# Patient Record
Sex: Male | Born: 1945 | Race: White | Hispanic: No | Marital: Married | Smoking: Never smoker
Health system: Southern US, Community
[De-identification: ages and names within clinical notes are randomized; demographics above are authoritative.]

---

## 2020-02-19 ENCOUNTER — Emergency Department: Payer: No Typology Code available for payment source

## 2020-02-19 ENCOUNTER — Other Ambulatory Visit: Payer: Self-pay

## 2020-02-19 ENCOUNTER — Inpatient Hospital Stay
Admission: EM | Admit: 2020-02-19 | Discharge: 2020-02-28 | DRG: 871 | Disposition: A | Discharge status: Hospice | Payer: No Typology Code available for payment source | Attending: Internal Medicine | Admitting: Internal Medicine

## 2020-02-19 DIAGNOSIS — Z87891 Personal history of nicotine dependence: Secondary | ICD-10-CM

## 2020-02-19 DIAGNOSIS — Z6841 Body Mass Index (BMI) 40.0 and over, adult: Secondary | ICD-10-CM

## 2020-02-19 DIAGNOSIS — J189 Pneumonia, unspecified organism: Secondary | ICD-10-CM

## 2020-02-19 DIAGNOSIS — B965 Pseudomonas (aeruginosa) (mallei) (pseudomallei) as the cause of diseases classified elsewhere: Secondary | ICD-10-CM | POA: Diagnosis present

## 2020-02-19 DIAGNOSIS — A419 Sepsis, unspecified organism: Principal | ICD-10-CM

## 2020-02-19 DIAGNOSIS — I493 Ventricular premature depolarization: Secondary | ICD-10-CM | POA: Diagnosis present

## 2020-02-19 DIAGNOSIS — R918 Other nonspecific abnormal finding of lung field: Secondary | ICD-10-CM

## 2020-02-19 DIAGNOSIS — R062 Wheezing: Secondary | ICD-10-CM

## 2020-02-19 DIAGNOSIS — E872 Acidosis: Secondary | ICD-10-CM | POA: Diagnosis present

## 2020-02-19 DIAGNOSIS — N390 Urinary tract infection, site not specified: Secondary | ICD-10-CM | POA: Diagnosis present

## 2020-02-19 DIAGNOSIS — D649 Anemia, unspecified: Secondary | ICD-10-CM | POA: Diagnosis present

## 2020-02-19 DIAGNOSIS — I10 Essential (primary) hypertension: Secondary | ICD-10-CM

## 2020-02-19 DIAGNOSIS — R4182 Altered mental status, unspecified: Secondary | ICD-10-CM | POA: Diagnosis not present

## 2020-02-19 DIAGNOSIS — F4310 Post-traumatic stress disorder, unspecified: Secondary | ICD-10-CM | POA: Diagnosis present

## 2020-02-19 DIAGNOSIS — F0391 Unspecified dementia with behavioral disturbance: Secondary | ICD-10-CM | POA: Diagnosis present

## 2020-02-19 DIAGNOSIS — Z20822 Contact with and (suspected) exposure to covid-19: Secondary | ICD-10-CM | POA: Diagnosis present

## 2020-02-19 DIAGNOSIS — R509 Fever, unspecified: Secondary | ICD-10-CM

## 2020-02-19 DIAGNOSIS — G9341 Metabolic encephalopathy: Secondary | ICD-10-CM

## 2020-02-19 DIAGNOSIS — Z9181 History of falling: Secondary | ICD-10-CM

## 2020-02-19 DIAGNOSIS — Z66 Do not resuscitate: Secondary | ICD-10-CM | POA: Diagnosis not present

## 2020-02-19 LAB — COMPREHENSIVE METABOLIC PANEL
ALT: 11 U/L (ref 0–44)
AST: 22 U/L (ref 15–41)
Albumin: 2.8 g/dL — ABNORMAL LOW (ref 3.5–5.0)
Alkaline Phosphatase: 104 U/L (ref 38–126)
Anion gap: 13 (ref 5–15)
BUN: 26 mg/dL — ABNORMAL HIGH (ref 8–23)
CO2: 23 mmol/L (ref 22–32)
Calcium: 9.2 mg/dL (ref 8.9–10.3)
Chloride: 101 mmol/L (ref 98–111)
Creatinine, Ser: 1.24 mg/dL (ref 0.61–1.24)
GFR, Estimated: >60 mL/min (ref >60)
Glucose, Bld: 164 mg/dL — ABNORMAL HIGH (ref 70–99)
Potassium: 4.2 mmol/L (ref 3.5–5.1)
Sodium: 137 mmol/L (ref 135–145)
Total Bilirubin: 0.7 mg/dL (ref 0.3–1.2)
Total Protein: 8.2 g/dL — ABNORMAL HIGH (ref 6.5–8.1)

## 2020-02-19 LAB — CBC WITH DIFFERENTIAL/PLATELET
Abs Immature Granulocytes: 0.05 10*3/uL (ref 0.00–0.07)
Basophils Absolute: 0.1 10*3/uL (ref 0.0–0.1)
Basophils Relative: 1 %
Eosinophils Absolute: 0.4 10*3/uL (ref 0.0–0.5)
Eosinophils Relative: 2 %
HCT: 33.4 % — ABNORMAL LOW (ref 39.0–52.0)
Hemoglobin: 11.1 g/dL — ABNORMAL LOW (ref 13.0–17.0)
Immature Granulocytes: 0 %
Lymphocytes Relative: 11 %
Lymphs Abs: 1.7 10*3/uL (ref 0.7–4.0)
MCH: 30 pg (ref 26.0–34.0)
MCHC: 33.2 g/dL (ref 30.0–36.0)
MCV: 90.3 fL (ref 80.0–100.0)
Monocytes Absolute: 1.4 10*3/uL — ABNORMAL HIGH (ref 0.1–1.0)
Monocytes Relative: 9 %
Neutro Abs: 11.9 10*3/uL — ABNORMAL HIGH (ref 1.7–7.7)
Neutrophils Relative %: 77 %
Platelets: 446 10*3/uL — ABNORMAL HIGH (ref 150–400)
RBC: 3.7 MIL/uL — ABNORMAL LOW (ref 4.22–5.81)
RDW: 13.7 % (ref 11.5–15.5)
WBC: 15.5 10*3/uL — ABNORMAL HIGH (ref 4.0–10.5)
nRBC: 0 % (ref 0.0–0.2)

## 2020-02-19 LAB — LACTIC ACID, PLASMA: Lactic Acid, Venous: 3.3 mmol/L (ref 0.5–1.9)

## 2020-02-19 MED ORDER — SODIUM CHLORIDE 0.9 % IV BOLUS
500.0000 mL | Freq: Once | INTRAVENOUS | Status: AC
  Administered 2020-02-19: 500 mL via INTRAVENOUS

## 2020-02-19 MED ORDER — LACTATED RINGERS IV SOLN: INTRAVENOUS | Status: AC

## 2020-02-19 MED ORDER — VANCOMYCIN HCL IN DEXTROSE 1-5 GM/200ML-% IV SOLN
1000.0000 mg | Freq: Once | INTRAVENOUS | Status: AC
  Administered 2020-02-20: 1000 mg via INTRAVENOUS
  Filled 2020-02-19: qty 200

## 2020-02-19 MED ORDER — SODIUM CHLORIDE 0.9 % IV SOLN: Freq: Once | INTRAVENOUS | Status: DC

## 2020-02-19 MED ORDER — SODIUM CHLORIDE 0.9 % IV SOLN
2.0000 g | Freq: Once | INTRAVENOUS | Status: AC
  Administered 2020-02-19: 2 g via INTRAVENOUS
  Filled 2020-02-19: qty 2

## 2020-02-19 MED ORDER — METRONIDAZOLE IN NACL 5-0.79 MG/ML-% IV SOLN
500.0000 mg | Freq: Once | INTRAVENOUS | Status: AC
  Administered 2020-02-19: 500 mg via INTRAVENOUS
  Filled 2020-02-19: qty 100

## 2020-02-19 NOTE — ED Notes (Signed)
Pt in ct 

## 2020-02-19 NOTE — ED Triage Notes (Addendum)
Pt to ED via EMS from home. Per ems initally called out for lift assist, pt found to be hypotensive at 90/64 pt given 500cc NS. Pt was seen at [facility] yesterday for unknown reason, pt has hx dementia. Per ems pt has multiple wounds to scrotum. Family states he has been altered from baseline since yesterday. Pt a&o x3. Disoriented to yr.

## 2020-02-19 NOTE — ED Provider Notes (Signed)
[facility] Emergency Department Provider Note    First MD Initiated Contact with Patient 02/19/20 2206     (approximate)  I have reviewed the triage vital signs and the nursing notes.   HISTORY  Chief Complaint Altered Mental Status  History limited due to dementia  HPI Joseph Potter is a 74 y.o. male presents to the ER for altered mental status, confusion and low blood pressure.  EMS was called out for a lift assist patient had been at the [place] all yesterday, wife reports that progressive today has been sleeping with significant weakness having difficulty getting about.  No specific falls.  1 EMS came by to evaluate patient help get him in the bed or chair found he was hypotensive on vitals.  No fever.  Was brought to the ER.  No recent antibiotics.  No cough or congestion.  Patient unable to provide much additional history.    History reviewed. No pertinent past medical history. No family history on file. History reviewed. No pertinent surgical history. There are no problems to display for this patient.     Prior to Admission medications   Not on File    Allergies Patient has no allergy information on record.    Social History Social History   Tobacco Use  . Smoking status: Not on file  Substance Use Topics  . Alcohol use: Not on file  . Drug use: Not on file    Review of Systems Patient denies headaches, rhinorrhea, blurry vision, numbness, shortness of breath, chest pain, edema, cough, abdominal pain, nausea, vomiting, diarrhea, dysuria, fevers, rashes or hallucinations unless otherwise stated above in HPI. ____________________________________________   PHYSICAL EXAM:  VITAL SIGNS: Vitals:   02/19/20 2155 02/19/20 2157  BP:  114/73  Pulse:  98  Resp:  20  Temp: 98.6 F (37 C)   SpO2:  95%    Constitutional: Alert,  Disoriented, chronically ill appearing Eyes: Conjunctivae are normal.  Head: Atraumatic. Nose: No  congestion/rhinnorhea. Mouth/Throat: Mucous membranes are moist.   Neck: No stridor. Painless ROM.  Cardiovascular: Normal rate, regular rhythm. Grossly normal heart sounds.  Good peripheral circulation. Respiratory: Normal respiratory effort.  No retractions. Lung sounds diminished 2/2 obesity Gastrointestinal: Soft, obese and nontender. No distention. No abdominal bruits. No CVA tenderness. Genitourinary: Does have some candidiasis from beneath his pannus that does also involve his anterior scrotum.  Scrotum is otherwise benign no crepitus.  No fluctuance.  No blistering. Musculoskeletal: No lower extremity tenderness.  2+ BLE edema.  No joint effusions. Neurologic:  Normal speech No gross focal neurologic deficits are appreciated. No facial droop Skin: candidiasis under pannus Psychiatric: calm, cooperative  ____________________________________________   LABS (all labs ordered are listed, but only abnormal results are displayed)  Results for orders placed or performed during the hospital encounter of 02/19/20 (from the past 24 hour(s))  CBC with Differential     Status: Abnormal   Collection Time: 02/19/20 10:02 PM  Result Value Ref Range   WBC 15.5 (H) 4.0 - 10.5 K/uL   RBC 3.70 (L) 4.22 - 5.81 MIL/uL   Hemoglobin 11.1 (L) 13.0 - 17.0 g/dL   HCT 89.1 (L) 39 - 52 %   MCV 90.3 80.0 - 100.0 fL   MCH 30.0 26.0 - 34.0 pg   MCHC 33.2 30.0 - 36.0 g/dL   RDW 69.4 50.3 - 88.8 %   Platelets 446 (H) 150 - 400 K/uL   nRBC 0.0 0.0 - 0.2 %  Neutrophils Relative % 77 %   Neutro Abs 11.9 (H) 1.7 - 7.7 K/uL   Lymphocytes Relative 11 %   Lymphs Abs 1.7 0.7 - 4.0 K/uL   Monocytes Relative 9 %   Monocytes Absolute 1.4 (H) 0.1 - 1.0 K/uL   Eosinophils Relative 2 %   Eosinophils Absolute 0.4 0.0 - 0.5 K/uL   Basophils Relative 1 %   Basophils Absolute 0.1 0.0 - 0.1 K/uL   Immature Granulocytes 0 %   Abs Immature Granulocytes 0.05 0.00 - 0.07 K/uL  Comprehensive metabolic panel     Status:  Abnormal   Collection Time: 02/19/20 10:02 PM  Result Value Ref Range   Sodium 137 135 - 145 mmol/L   Potassium 4.2 3.5 - 5.1 mmol/L   Chloride 101 98 - 111 mmol/L   CO2 23 22 - 32 mmol/L   Glucose, Bld 164 (H) 70 - 99 mg/dL   BUN 26 (H) 8 - 23 mg/dL   Creatinine, Ser 5.46 0.61 - 1.24 mg/dL   Calcium 9.2 8.9 - 27.0 mg/dL   Total Protein 8.2 (H) 6.5 - 8.1 g/dL   Albumin 2.8 (L) 3.5 - 5.0 g/dL   AST 22 15 - 41 U/L   ALT 11 0 - 44 U/L   Alkaline Phosphatase 104 38 - 126 U/L   Total Bilirubin 0.7 0.3 - 1.2 mg/dL   GFR, Estimated >35 >00 mL/min   Anion gap 13 5 - 15   ____________________________________________  EKG My review and personal interpretation at Time: 21:58   Indication: ams  Rate: 90  Rhythm: sinus Axis: normal Other: nonspecific st abn, no stemi ____________________________________________  RADIOLOGY  I personally reviewed all radiographic images ordered to evaluate for the above acute complaints and reviewed radiology reports and findings.  These findings were personally discussed with the patient.  Please see medical record for radiology report.  ____________________________________________   PROCEDURES  Procedure(s) performed:  Procedures    Critical Care performed: no ____________________________________________   INITIAL IMPRESSION / ASSESSMENT AND PLAN / ED COURSE  Pertinent labs & imaging results that were available during my care of the patient were reviewed by me and considered in my medical decision making (see chart for details).   DDX: Dehydration, sepsis, pna, uti, hypoglycemia, cva, drug effect, withdrawal, encephalitis   Joseph Potter is a 74 y.o. who presents to the ED with presentation as described above.  Very broad differential will order blood work.  EKG was nonspecific changes.  Patient not complaining any chest pain right now.  Will order CT imaging as well as x-ray.  Will provide IV fluids.  Will order septic work-up though is not  meeting criteria for sepsis at this time.  Patient will be signed out to oncoming physician pending follow-up on labs and reassessment.     The patient was evaluated in Emergency Department today for the symptoms described in the history of present illness. He/she was evaluated in the context of the global COVID-19 pandemic, which necessitated consideration that the patient might be at risk for infection with the SARS-CoV-2 virus that causes COVID-19. Institutional protocols and algorithms that pertain to the evaluation of patients at risk for COVID-19 are in a state of rapid change based on information released by regulatory bodies including the CDC and federal and state organizations. These policies and algorithms were followed during the patient's care in the ED.  As part of my medical decision making, I reviewed the following data within the electronic MEDICAL RECORD NUMBER  Nursing notes reviewed and incorporated, Labs reviewed, notes from prior ED visits and [place] Controlled Substance Database   ____________________________________________   FINAL CLINICAL IMPRESSION(S) / ED DIAGNOSES  Final diagnoses:  Altered mental status, unspecified altered mental status type      NEW MEDICATIONS STARTED DURING THIS VISIT:  New Prescriptions   No medications on file     Note:  This document was prepared using Dragon voice recognition software and may include unintentional dictation errors.    Willy Eddy, MD 02/19/20 [phone]

## 2020-02-19 NOTE — Progress Notes (Signed)
PHARMACY -  BRIEF ANTIBIOTIC NOTE   Pharmacy has received consult(s) for vancomycin and cefepime from an ED provider.  The patient's profile has been reviewed for ht/wt/allergies/indication/available labs.    One time order(s) placed for vanc 1 g + cefepime 2 g  Further antibiotics/pharmacy consults should be ordered by admitting physician if indicated.                       Thank you,  Pricilla Riffle, PharmD 02/19/2020  11:22 PM

## 2020-02-20 ENCOUNTER — Encounter: Payer: Self-pay | Admitting: Internal Medicine

## 2020-02-20 ENCOUNTER — Emergency Department: Payer: No Typology Code available for payment source

## 2020-02-20 DIAGNOSIS — I10 Essential (primary) hypertension: Secondary | ICD-10-CM

## 2020-02-20 DIAGNOSIS — Z87891 Personal history of nicotine dependence: Secondary | ICD-10-CM | POA: Diagnosis not present

## 2020-02-20 DIAGNOSIS — F0391 Unspecified dementia with behavioral disturbance: Secondary | ICD-10-CM | POA: Diagnosis present

## 2020-02-20 DIAGNOSIS — E872 Acidosis: Secondary | ICD-10-CM | POA: Diagnosis present

## 2020-02-20 DIAGNOSIS — Z66 Do not resuscitate: Secondary | ICD-10-CM | POA: Diagnosis not present

## 2020-02-20 DIAGNOSIS — D649 Anemia, unspecified: Secondary | ICD-10-CM | POA: Diagnosis present

## 2020-02-20 DIAGNOSIS — Z9181 History of falling: Secondary | ICD-10-CM | POA: Diagnosis not present

## 2020-02-20 DIAGNOSIS — A419 Sepsis, unspecified organism: Secondary | ICD-10-CM

## 2020-02-20 DIAGNOSIS — B965 Pseudomonas (aeruginosa) (mallei) (pseudomallei) as the cause of diseases classified elsewhere: Secondary | ICD-10-CM | POA: Diagnosis present

## 2020-02-20 DIAGNOSIS — N390 Urinary tract infection, site not specified: Secondary | ICD-10-CM | POA: Diagnosis present

## 2020-02-20 DIAGNOSIS — R4182 Altered mental status, unspecified: Secondary | ICD-10-CM | POA: Diagnosis present

## 2020-02-20 DIAGNOSIS — J189 Pneumonia, unspecified organism: Secondary | ICD-10-CM | POA: Diagnosis present

## 2020-02-20 DIAGNOSIS — R918 Other nonspecific abnormal finding of lung field: Secondary | ICD-10-CM | POA: Diagnosis not present

## 2020-02-20 DIAGNOSIS — Z515 Encounter for palliative care: Secondary | ICD-10-CM | POA: Diagnosis not present

## 2020-02-20 DIAGNOSIS — F4310 Post-traumatic stress disorder, unspecified: Secondary | ICD-10-CM | POA: Diagnosis present

## 2020-02-20 DIAGNOSIS — G9341 Metabolic encephalopathy: Secondary | ICD-10-CM | POA: Diagnosis present

## 2020-02-20 DIAGNOSIS — Z6841 Body Mass Index (BMI) 40.0 and over, adult: Secondary | ICD-10-CM | POA: Diagnosis not present

## 2020-02-20 DIAGNOSIS — Z20822 Contact with and (suspected) exposure to covid-19: Secondary | ICD-10-CM | POA: Diagnosis present

## 2020-02-20 DIAGNOSIS — R062 Wheezing: Secondary | ICD-10-CM | POA: Diagnosis not present

## 2020-02-20 DIAGNOSIS — Z7189 Other specified counseling: Secondary | ICD-10-CM | POA: Diagnosis not present

## 2020-02-20 DIAGNOSIS — I493 Ventricular premature depolarization: Secondary | ICD-10-CM | POA: Diagnosis present

## 2020-02-20 DIAGNOSIS — Z5181 Encounter for therapeutic drug level monitoring: Secondary | ICD-10-CM | POA: Diagnosis not present

## 2020-02-20 LAB — URINALYSIS, COMPLETE (UACMP) WITH MICROSCOPIC
Bilirubin Urine: NEGATIVE
Glucose, UA: NEGATIVE mg/dL
Hgb urine dipstick: NEGATIVE
Ketones, ur: 5 mg/dL — AB
Nitrite: NEGATIVE
Protein, ur: NEGATIVE mg/dL
Specific Gravity, Urine: 1.015 (ref 1.005–1.030)
pH: 5 (ref 5.0–8.0)

## 2020-02-20 LAB — BASIC METABOLIC PANEL
Anion gap: 12 (ref 5–15)
BUN: 23 mg/dL (ref 8–23)
CO2: 21 mmol/L — ABNORMAL LOW (ref 22–32)
Calcium: 8.8 mg/dL — ABNORMAL LOW (ref 8.9–10.3)
Chloride: 103 mmol/L (ref 98–111)
Creatinine, Ser: 1.23 mg/dL (ref 0.61–1.24)
GFR, Estimated: >60 mL/min (ref >60)
Glucose, Bld: 145 mg/dL — ABNORMAL HIGH (ref 70–99)
Potassium: 4.8 mmol/L (ref 3.5–5.1)
Sodium: 136 mmol/L (ref 135–145)

## 2020-02-20 LAB — CBC
HCT: 32.3 % — ABNORMAL LOW (ref 39.0–52.0)
Hemoglobin: 10.8 g/dL — ABNORMAL LOW (ref 13.0–17.0)
MCH: 30.6 pg (ref 26.0–34.0)
MCHC: 33.4 g/dL (ref 30.0–36.0)
MCV: 91.5 fL (ref 80.0–100.0)
Platelets: 386 10*3/uL (ref 150–400)
RBC: 3.53 MIL/uL — ABNORMAL LOW (ref 4.22–5.81)
RDW: 13.9 % (ref 11.5–15.5)
WBC: 13.4 10*3/uL — ABNORMAL HIGH (ref 4.0–10.5)
nRBC: 0 % (ref 0.0–0.2)

## 2020-02-20 LAB — TROPONIN I (HIGH SENSITIVITY)
Troponin I (High Sensitivity): 19 ng/L — ABNORMAL HIGH (ref <18)
Troponin I (High Sensitivity): 16 ng/L (ref <18)

## 2020-02-20 LAB — PROTIME-INR
INR: 1.1 (ref 0.8–1.2)
Prothrombin Time: 13.4 seconds (ref 11.4–15.2)

## 2020-02-20 LAB — LACTIC ACID, PLASMA: Lactic Acid, Venous: 1.8 mmol/L (ref 0.5–1.9)

## 2020-02-20 LAB — RESPIRATORY PANEL BY RT PCR (FLU A&B, COVID)
Influenza A by PCR: NEGATIVE
Influenza B by PCR: NEGATIVE
SARS Coronavirus 2 by RT PCR: NEGATIVE

## 2020-02-20 LAB — PROCALCITONIN: Procalcitonin: <0.1 ng/mL

## 2020-02-20 LAB — CORTISOL-AM, BLOOD: Cortisol - AM: 17 ug/dL (ref 6.7–22.6)

## 2020-02-20 LAB — BRAIN NATRIURETIC PEPTIDE: B Natriuretic Peptide: 126.4 pg/mL — ABNORMAL HIGH (ref 0.0–100.0)

## 2020-02-20 MED ORDER — DONEPEZIL HCL 5 MG PO TABS
10.0000 mg | ORAL_TABLET | Freq: Every day | ORAL | Status: DC
  Administered 2020-02-20 – 2020-02-27 (×8): 10 mg via ORAL
  Filled 2020-02-20 (×9): qty 2

## 2020-02-20 MED ORDER — METOPROLOL TARTRATE 25 MG PO TABS
25.0000 mg | ORAL_TABLET | Freq: Two times a day (BID) | ORAL | Status: DC
  Administered 2020-02-20 – 2020-02-28 (×14): 25 mg via ORAL
  Filled 2020-02-20 (×16): qty 1

## 2020-02-20 MED ORDER — AMITRIPTYLINE HCL 50 MG PO TABS
50.0000 mg | ORAL_TABLET | Freq: Every day | ORAL | Status: DC
  Administered 2020-02-20 – 2020-02-27 (×8): 50 mg via ORAL
  Filled 2020-02-20 (×9): qty 1

## 2020-02-20 MED ORDER — SODIUM CHLORIDE 0.9 % IV SOLN
2.0000 g | INTRAVENOUS | Status: AC
  Administered 2020-02-20 – 2020-02-21 (×2): 2 g via INTRAVENOUS
  Filled 2020-02-20 (×2): qty 20

## 2020-02-20 MED ORDER — ATORVASTATIN CALCIUM 20 MG PO TABS
20.0000 mg | ORAL_TABLET | Freq: Every day | ORAL | Status: DC
  Administered 2020-02-20 – 2020-02-27 (×8): 20 mg via ORAL
  Filled 2020-02-20 (×8): qty 1

## 2020-02-20 MED ORDER — ACETAMINOPHEN 650 MG RE SUPP: 650.0000 mg | Freq: Four times a day (QID) | RECTAL | Status: DC | PRN

## 2020-02-20 MED ORDER — ONDANSETRON HCL 4 MG PO TABS: 4.0000 mg | ORAL_TABLET | Freq: Four times a day (QID) | ORAL | Status: DC | PRN

## 2020-02-20 MED ORDER — SENNOSIDES-DOCUSATE SODIUM 8.6-50 MG PO TABS: 1.0000 | ORAL_TABLET | Freq: Every evening | ORAL | Status: DC | PRN

## 2020-02-20 MED ORDER — MELATONIN 5 MG PO TABS
5.0000 mg | ORAL_TABLET | Freq: Every day | ORAL | Status: DC
  Administered 2020-02-20 – 2020-02-27 (×8): 5 mg via ORAL
  Filled 2020-02-20 (×9): qty 1

## 2020-02-20 MED ORDER — HALOPERIDOL LACTATE 5 MG/ML IJ SOLN
2.0000 mg | Freq: Four times a day (QID) | INTRAMUSCULAR | Status: DC | PRN
  Administered 2020-02-20 – 2020-02-24 (×7): 2 mg via INTRAVENOUS
  Filled 2020-02-20 (×7): qty 1

## 2020-02-20 MED ORDER — SODIUM CHLORIDE 0.9 % IV SOLN
500.0000 mg | INTRAVENOUS | Status: AC
  Administered 2020-02-20 – 2020-02-22 (×3): 500 mg via INTRAVENOUS
  Filled 2020-02-20 (×3): qty 500

## 2020-02-20 MED ORDER — ENOXAPARIN SODIUM 100 MG/ML ~~LOC~~ SOLN
0.5000 mg/kg | SUBCUTANEOUS | Status: DC
  Filled 2020-02-20 (×2): qty 1

## 2020-02-20 MED ORDER — ALLOPURINOL 100 MG PO TABS
100.0000 mg | ORAL_TABLET | Freq: Two times a day (BID) | ORAL | Status: DC
  Administered 2020-02-20 – 2020-02-28 (×16): 100 mg via ORAL
  Filled 2020-02-20 (×18): qty 1

## 2020-02-20 MED ORDER — HALOPERIDOL LACTATE 5 MG/ML IJ SOLN
INTRAMUSCULAR | Status: AC
  Administered 2020-02-20: 2 mg via INTRAVENOUS
  Filled 2020-02-20: qty 1

## 2020-02-20 MED ORDER — ACETAMINOPHEN 325 MG PO TABS: 650.0000 mg | ORAL_TABLET | Freq: Four times a day (QID) | ORAL | Status: DC | PRN

## 2020-02-20 MED ORDER — ONDANSETRON HCL 4 MG/2ML IJ SOLN: 4.0000 mg | Freq: Four times a day (QID) | INTRAMUSCULAR | Status: DC | PRN

## 2020-02-20 NOTE — ED Notes (Signed)
Patient awake and alert.  Patient continues to hallucinate and is confused to how long he has been in the hospital.  States "I have been here for 3 days"..  Dr. Rito Ehrlich called and notified.

## 2020-02-20 NOTE — ED Notes (Signed)
Patient placed on bed alarm at this time, continues to have visual hallucinations, easily redirected and cooperative.

## 2020-02-20 NOTE — ED Notes (Signed)
Sitter at bedside.

## 2020-02-20 NOTE — ED Notes (Signed)
[place] NP notified patient remain confused and agitated, and pulling out INTs. No new orders.

## 2020-02-20 NOTE — ED Notes (Signed)
Dr Rito Ehrlich contacted to discuss pt care at this time

## 2020-02-20 NOTE — ED Notes (Signed)
Dr. Manson Passey attempted to cath pt at this time with no success, pt stood with assistance at bedside attempting to use urinal with no success.

## 2020-02-20 NOTE — ED Notes (Signed)
Occupational Therapist at bedside.

## 2020-02-20 NOTE — ED Notes (Signed)
MD at bedside with patient/wife

## 2020-02-20 NOTE — ED Notes (Signed)
Pharmacy contacted at this time regarding pt medications

## 2020-02-20 NOTE — ED Notes (Signed)
Patient remains confused and visual hallucinations, wife requesting to speak with doctor, patient wants to leave but redirected at this time.

## 2020-02-20 NOTE — Evaluation (Addendum)
Occupational Therapy Evaluation Patient Details Name: Joseph Potter MRN: [id number] DOB: [birth year] Today's Date: 02/20/2020    History of Present Illness Pt is a 74y.o. male with PMHx of HTN, obesity, DM, frequent falls who was brought in by EMS who was initially called to the home for lift assist but on the evaluation patient was found to be hypotensive at 90/64.  Wife reports pt has been confused for the past 24 hours. Negative for fever, chills, cough, vomiting, diarrhea, and abdominal pain or chest pain. In ED, pt was noted to have leukocytosis. Chest x-ray showed a large rounded opacity in the left lung. Hospitalized for acute encephalopathy.   Clinical Impression   Joseph Potter presents today as highly agitated, anxious, attempting to leave the ED AMA. He states that he believes he is fine and that his behavior is at baseline, but wife, who is present in room, reports that he has been "way off" for the past 24+ hours and that his "mind is usually much clearer than it is today." Joseph Potter is experiencing visual hallucinations and perhaps paranoia -- he believes there is someone standing outside his room, staring at him. He denies pain, other than a burning sensation with urination. He is able to give his name, but does not know day or month (he is oriented to year), nor where he is. At home, pt's wife does all driving, cooking, cleaning, and shopping, and assists pt with dressing and grooming. Pt takes primarily sponge baths from sitting. He ambulates short distances with a 4WW and uses a manual wheelchair when going beyond more than a few yards. Pt's wife manages pt's medications. She reports that the [facility] had recommended insulin injections to manage his diabetes, but that they had to switch to BID Metformin because pt would not allow her to give him injections. She states that he also will not allow her to check his glucose levels -- she manages to get a finger stick for testing once  every 3-4 weeks. Pt's wife also reports that pt sometimes wakes up during the night and attempts to leave the house. During today's evaluation, pt ambulated ~ 25 ft with a RW before becoming SOB. Pt required Max A for stand<sit and for bed mobility, displaying confusion about how to go about getting into bed, attempting to go from standing to a prone position. Pt demonstrates poor safety awareness and judgement, reduced strength and endurance, impaired balance and coordination, and cognitive deficits. Recommend ongoing OT while hospitalized. SNF could be appropriate post-discharge; however, wife says she is opposed to SNF and states that she is coordinating presently with [facility] to obtain [facility] services, including OT, PT, nursing, and non-clinical care. Per her report, [facility] has or will shortly approve those services, and a home-based care team should be in place imminently. If that is not the case, this therapist would recommend SNF -- this pt's care needs seem too great for the wife to manage on her own.    Follow Up Recommendations  Home health OT;Supervision/Assistance - 24 hour    Equipment Recommendations  Tub/shower seat    Recommendations for Other Services       Precautions / Restrictions Precautions Precautions: Fall Restrictions Weight Bearing Restrictions: No      Mobility Bed Mobility Overal bed mobility: Needs Assistance Bed Mobility: Rolling;Sit to Supine Rolling: Min guard     Sit to supine: Min assist        Transfers Overall transfer level: Needs assistance Equipment used:  Rolling walker (2 wheeled) Transfers: Lateral/Scoot Transfers;Sit to/from Stand Sit to Stand: Max assist (Max A, verbal and tactile cues required for stand<sit.)        Lateral/Scoot Transfers: Min guard (VCs) General transfer comment: pt displays poor safety awareness, requires frequent redirection and multimodal cues    Balance Overall balance assessment: Needs assistance Sitting-balance  support: Feet supported;Bilateral upper extremity supported Sitting balance-Leahy Scale: Fair     Standing balance support: Bilateral upper extremity supported Standing balance-Leahy Scale: Fair                             ADL either performed or assessed with clinical judgement   ADL Overall ADL's : Needs assistance/impaired  Lower Body Dressing: Maximal assistance   Toileting- Clothing Manipulation and Hygiene: Moderate assistance   Functional mobility during ADLs: Moderate assistance       Vision         Perception     Praxis      Pertinent Vitals/Pain       Hand Dominance     Extremity/Trunk Assessment Upper Extremity Assessment Upper Extremity Assessment: Generalized weakness   Lower Extremity Assessment Lower Extremity Assessment: Generalized weakness       Communication Communication Communication: No difficulties   Cognition Arousal/Alertness: Awake/alert Behavior During Therapy: Agitated;Impulsive;Anxious Overall Cognitive Status: Impaired/Different from baseline Area of Impairment: Orientation;Attention;Safety/judgement;Following commands;Problem solving                 Orientation Level: Place;Time;Situation     Following Commands: Follows one step commands inconsistently Safety/Judgement: Decreased awareness of safety;Decreased awareness of deficits     General Comments: Having visual hallucinations. Pt highly agitated, repeatedly stating that he is leaving immediately and attempting to walk out the door.   General Comments  Pt confused, slightly aggressive, impulsive, anxious.    Exercises Other Exercises Other Exercises: Educ re POC, role of OT, safety awareness.   Shoulder Instructions      Home Living Family/patient expects to be discharged to:: Private residence Living Arrangements: Spouse/significant other Available Help at Discharge: Family;Available 24 hours/day Type of Home: House Home Access: Ramped  entrance     Home Layout: One level     Bathroom Shower/Tub: Producer, television/film/video: Handicapped height Bathroom Accessibility: Yes   Home Equipment: Environmental consultant - 4 wheels;Wheelchair - manual;Grab bars - toilet;Grab bars - tub/shower          Prior Functioning/Environment Level of Independence: Needs assistance    ADL's / Homemaking Assistance Needed: Wife does all driving, shopping, and cooking; she assists pt with dressing and bathing            OT Problem List: Decreased strength;Decreased activity tolerance;Decreased coordination;Impaired balance (sitting and/or standing);Decreased knowledge of use of DME or AE;Decreased safety awareness;Decreased cognition;Obesity      OT Treatment/Interventions: Self-care/ADL training;DME and/or AE instruction;Therapeutic activities;Balance training;Therapeutic exercise;Energy conservation;Patient/family education;Cognitive remediation/compensation    OT Goals(Current goals can be found in the care plan section) Acute Rehab OT Goals Patient Stated Goal: to go home immediately OT Goal Formulation: With patient/family Time For Goal Achievement: 03/05/20 Potential to Achieve Goals: Fair  OT Frequency: Min 1X/week   Barriers to D/C:            Co-evaluation              AM-PAC OT "6 Clicks" Daily Activity     Outcome Measure Help from another person eating meals?: None Help from  another person taking care of personal grooming?: A Little Help from another person toileting, which includes using toliet, bedpan, or urinal?: A Lot Help from another person bathing (including washing, rinsing, drying)?: A Lot Help from another person to put on and taking off regular upper body clothing?: A Little Help from another person to put on and taking off regular lower body clothing?: A Lot 6 Click Score: 16   End of Session Equipment Utilized During Treatment: Rolling walker Nurse Communication: Other (comment) (agitation,  confusion)  Activity Tolerance: Treatment limited secondary to agitation;Treatment limited secondary to medical complications (Comment) (Pt limited by confusion, hallucinations) Patient left: in bed;with call bell/phone within reach;with family/visitor present  OT Visit Diagnosis: Unsteadiness on feet (R26.81);Repeated falls (R29.6);History of falling (Z91.81);Muscle weakness (generalized) (M62.81);Other symptoms and signs involving cognitive function                Time: 1413-1450 OT Time Calculation (min): 37 min Charges:  OT General Charges $OT Visit: 1 Visit OT Evaluation $OT Eval Moderate Complexity: 1 Mod OT Treatments $Self Care/Home Management : 23-37 mins  Latina Craver, PhD, MS, OTR/L ascom [phone] 02/20/20, 3:34 PM

## 2020-02-20 NOTE — Progress Notes (Signed)
PHARMACIST - PHYSICIAN COMMUNICATION  CONCERNING:  Enoxaparin (Lovenox) for DVT Prophylaxis    RECOMMENDATION: Patient was prescribed enoxaparin 40 mg q24 hours for VTE prophylaxis.   Filed Weights   02/19/20 2158  Weight: (!) 172.4 kg (380 lb)    Body mass index is 51.54 kg/m.  Estimated Creatinine Clearance: 88 mL/min (by C-G formula based on SCr of 1.24 mg/dL).   Based on [facility] policy patient is candidate for enoxaparin 0.5mg /kg TBW SQ every 24 hours based on BMI being >30.   DESCRIPTION: Pharmacy has adjusted enoxaparin dose per [facility] policy.  Patient is now receiving enoxaparin 85 mg every 24 hours    Pricilla Riffle, PharmD Clinical Pharmacist  02/20/2020 1:24 AM

## 2020-02-20 NOTE — Progress Notes (Addendum)
[facility] PROGRESS NOTE   Joseph Potter JKD:[id number] DOB: 1945-10-01 DOA: 02/19/2020  PCP: Pcp, No  Brief History/Interval Summary: 74 y.o. male with medical history significant for Hypertension and morbid obesity with history of frequent falls who was brought in by EMS who was initially called to the home for lift assist but on the evaluation patient was found to be hypotensive at 90/64.  Family reports that patient has been a bit confused for the past 24 hours.  He has had no fever or chills no cough or shortness of breath, no vomiting diarrhea and no complaints of abdominal pain or chest pain.  History limited due to mild confusion.  In the emergency department patient was noted to be normotensive.  He was noted to have leukocytosis.  Chest x-ray showed a large rounded opacity in the left lung.  Hospitalized for acute encephalopathy likely due to infection.  Reason for Visit: Acute metabolic encephalopathy.  Urinary tract infection  Consultants: None yet  Procedures: None yet  Antibiotics: Anti-infectives (From admission, onward)   Start     Dose/Rate Route Frequency Ordered Stop   02/20/20 0800  cefTRIAXone (ROCEPHIN) 2 g in sodium chloride 0.9 % 100 mL IVPB        2 g 200 mL/hr over 30 Minutes Intravenous Every 24 hours 02/20/20 0121     02/20/20 0200  azithromycin (ZITHROMAX) 500 mg in sodium chloride 0.9 % 250 mL IVPB        500 mg 250 mL/hr over 60 Minutes Intravenous Every 24 hours 02/20/20 0121     02/19/20 2315  ceFEPIme (MAXIPIME) 2 g in sodium chloride 0.9 % 100 mL IVPB        2 g 200 mL/hr over 30 Minutes Intravenous  Once 02/19/20 2313 02/20/20 0007   02/19/20 2315  metroNIDAZOLE (FLAGYL) IVPB 500 mg        500 mg 100 mL/hr over 60 Minutes Intravenous  Once 02/19/20 2313 02/20/20 0055   02/19/20 2315  vancomycin (VANCOCIN) IVPB 1000 mg/200 mL premix        1,000 mg 200 mL/hr over 60 Minutes Intravenous  Once 02/19/20 2313 02/20/20 0131       Subjective/Interval History: Noted to be confused this morning.  Discussed with nursing staff.  No falls reported.  He does admit to some burning sensation with urination.  Has any nausea vomiting.     Assessment/Plan:  Acute metabolic encephalopathy Most likely secondary to infection.  Patient's UA is noted to be abnormal.  He does complain of dysuria.  No obvious focal neurological deficits noted.  CT head did not show any acute findings.  Continue to monitor.  Urinary tract infection Noted to be afebrile with normal heart rate normal respiratory rate and normal saturations.  No evidence for sepsis currently.  Sepsis ruled out.  Continue with IV ceftriaxone and azithromycin.  Follow-up on cultures.  We will send off urine culture as well.  Left lung mass This was noted on CT scan.  Patient has a history of tobacco abuse.  He states that he smoked more than 2 to 3 packs of cigarettes daily but has been quit for about 15 to 20 years.  History is unreliable.  Will need tissue diagnosis before referral to oncology.  Will discuss with pulmonology.  May need to wait for his acute issues to subside. CT chest did not show any evidence for pneumonia.  Essential hypertension Patient was hypotensive initially.  Blood pressures are stable.  Creeping  up to the hypertensive range.  Continue to hold his antihypertensives for now.  Morbid obesity Estimated body mass index is 51.54 kg/m as calculated from the following:   Height as of this encounter: 6' (1.829 m).   Weight as of this encounter: 172.4 kg.   ADDENDUM Called by the nurse stating that the patient wanted to leave the hospital. Micah FlesherWent to the bedside to discuss with patient and his wife. His wife mentioned that patient has been having worsening confusion for the past 6 months. He has good days and bad days. Apparently diagnosed with dementia. Patient does not understand the reason he is in the hospital. He does not understand what will  happen if he goes home too early. He does not understand that he likely will get worse. Patient does not have capacity to decide due to his underlying cognitive impairment/dementia. This was discussed with the wife. She was told that if she would still like to take responsibility for him and take him home it will be AGAINST MEDICAL ADVICE. Patient was seen by Occupational Therapy. Wife decided not to take the patient home. Patient to be given Haldol to control his agitation.  Reason for visit DVT Prophylaxis: Lovenox Code Status: Full code Family Communication: We will discuss with his wife later today Disposition Plan: Hopefully return home when improved.  PT and OT evaluation.  Status is: Inpatient  Remains inpatient appropriate because:Altered mental status, IV treatments appropriate due to intensity of illness or inability to take PO and Inpatient level of care appropriate due to severity of illness   Dispo: The patient is from: Home              Anticipated d/c is to: Home              Anticipated d/c date is: 2 days              Patient currently is not medically stable to d/c.     Medications:  Scheduled: . enoxaparin (LOVENOX) injection  0.5 mg/kg Subcutaneous Q24H   Continuous: . azithromycin Stopped (02/20/20 0426)  . cefTRIAXone (ROCEPHIN)  IV Stopped (02/20/20 16100812)  . lactated ringers 150 mL/hr at 02/20/20 0919   RUE:[id number]:acetaminophen **OR** acetaminophen, ondansetron **OR** ondansetron (ZOFRAN) IV, senna-docusate   Objective:  Vital Signs  Vitals:   02/19/20 2342 02/20/20 0249 02/20/20 0532 02/20/20 0930  BP: (!) 144/79  135/67 (!) 162/94  Pulse: 81 73 76   Resp: 20 20 20 16   Temp:      TempSrc:      SpO2: 93% 94% 92%   Weight:      Height:        Intake/Output Summary (Last 24 hours) at 02/20/2020 1050 Last data filed at 02/20/2020 [id number] Gross per 24 hour  Intake 1795 ml  Output --  Net 1795 ml   Filed Weights   02/19/20 2158  Weight: (!) 172.4 kg     General appearance: Awake alert.  In no distress.  Noted to be distracted Resp: Clear to auscultation bilaterally.  Normal effort Cardio: S1-S2 is normal regular.  No S3-S4.  No rubs murmurs or bruit GI: Abdomen is soft.  Nontender nondistended.  Bowel sounds are present normal.  No masses organomegaly Extremities: No edema.  Full range of motion of lower extremities. Neurologic: Disoriented.  No facial asymmetry.  Motor strength equal bilateral upper and lower extremities.   Lab Results:  Data Reviewed: I have personally reviewed following labs and imaging studies  CBC: Recent Labs  Lab 02/19/20 2202 02/20/20 0458  WBC 15.5* 13.4*  NEUTROABS 11.9*  --   HGB 11.1* 10.8*  HCT 33.4* 32.3*  MCV 90.3 91.5  PLT 446* 386    Basic Metabolic Panel: Recent Labs  Lab 02/19/20 2202 02/20/20 0458  NA 137 136  K 4.2 4.8  CL 101 103  CO2 23 21*  GLUCOSE 164* 145*  BUN 26* 23  CREATININE 1.24 1.23  CALCIUM 9.2 8.8*    GFR: Estimated Creatinine Clearance: 88.7 mL/min (by C-G formula based on SCr of 1.23 mg/dL).  Liver Function Tests: Recent Labs  Lab 02/19/20 2202  AST 22  ALT 11  ALKPHOS 104  BILITOT 0.7  PROT 8.2*  ALBUMIN 2.8*    Coagulation Profile: Recent Labs  Lab 02/20/20 0458  INR 1.1      Recent Results (from the past 240 hour(s))  Blood culture (single)     Status: None (Preliminary result)   Collection Time: 02/19/20 11:30 PM   Specimen: BLOOD  Result Value Ref Range Status   Specimen Description BLOOD LEFT ANTECUBITAL  Final   Special Requests   Final    BOTTLES DRAWN AEROBIC AND ANAEROBIC Blood Culture adequate volume   Culture   Final    NO GROWTH < 12 HOURS Performed at [facility], [address], [place], [place] [address]    Report Status PENDING  Incomplete  Respiratory Panel by RT PCR (Flu A&B, Covid) - Nasopharyngeal Swab     Status: None   Collection Time: 02/20/20  4:58 AM   Specimen: Nasopharyngeal Swab  Result  Value Ref Range Status   SARS Coronavirus 2 by RT PCR NEGATIVE NEGATIVE Final    Comment: (NOTE) SARS-CoV-2 target nucleic acids are NOT DETECTED.  The SARS-CoV-2 RNA is generally detectable in upper respiratoy specimens during the acute phase of infection. The lowest concentration of SARS-CoV-2 viral copies this assay can detect is 131 copies/mL. A negative result does not preclude SARS-Cov-2 infection and should not be used as the sole basis for treatment or other patient management decisions. A negative result may occur with  improper specimen collection/handling, submission of specimen other than nasopharyngeal swab, presence of viral mutation(s) within the areas targeted by this assay, and inadequate number of viral copies (<131 copies/mL). A negative result must be combined with clinical observations, patient history, and epidemiological information. The expected result is Negative.  Fact Sheet for Patients:  https://www.moore.com/  Fact Sheet for Healthcare Providers:  https://www.young.biz/  This test is no t yet approved or cleared by the [place] FDA and  has been authorized for detection and/or diagnosis of SARS-CoV-2 by FDA under an Emergency Use Authorization (EUA). This EUA will remain  in effect (meaning this test can be used) for the duration of the COVID-19 declaration under Section 564(b)(1) of the Act, 21 U.S.C. section 360bbb-3(b)(1), unless the authorization is terminated or revoked sooner.     Influenza A by PCR NEGATIVE NEGATIVE Final   Influenza B by PCR NEGATIVE NEGATIVE Final    Comment: (NOTE) The Xpert Xpress SARS-CoV-2/FLU/RSV assay is intended as an aid in  the diagnosis of influenza from Nasopharyngeal swab specimens and  should not be used as a sole basis for treatment. Nasal washings and  aspirates are unacceptable for Xpert Xpress SARS-CoV-2/FLU/RSV  testing.  Fact Sheet for  Patients: https://www.moore.com/  Fact Sheet for Healthcare Providers: https://www.young.biz/  This test is not yet approved or cleared by the [place] FDA  and  has been authorized for detection and/or diagnosis of SARS-CoV-2 by  FDA under an Emergency Use Authorization (EUA). This EUA will remain  in effect (meaning this test can be used) for the duration of the  Covid-19 declaration under Section 564(b)(1) of the Act, 21  U.S.C. section 360bbb-3(b)(1), unless the authorization is  terminated or revoked. Performed at [facility], [address], [place], [place] [address]       Radiology Studies: CT Head Wo Contrast  Result Date: 02/19/2020 CLINICAL DATA:  74 year old male with altered mental status. EXAM: CT HEAD WITHOUT CONTRAST TECHNIQUE: Contiguous axial images were obtained from the base of the skull through the vertex without intravenous contrast. COMPARISON:  None. FINDINGS: Brain: Mild age-related atrophy and chronic microvascular ischemic changes. There is no acute intracranial hemorrhage. No mass effect or midline shift no extra-axial fluid collection. Vascular: No hyperdense vessel or unexpected calcification. Skull: Normal. Negative for fracture or focal lesion. Sinuses/Orbits: No acute finding. Other: None IMPRESSION: No acute intracranial pathology. Electronically Signed   By: Elgie Collard M.D.   On: 02/19/2020 23:10   CT Chest Wo Contrast  Result Date: 02/20/2020 CLINICAL DATA:  Chest pain EXAM: CT CHEST WITHOUT CONTRAST TECHNIQUE: Multidetector CT imaging of the chest was performed following the standard protocol without IV contrast. COMPARISON:  None. FINDINGS: Cardiovascular: Calcific aortic atherosclerosis. Coronary artery atherosclerosis. No pericardial effusion. Mediastinum/Nodes: No enlarged mediastinal or axillary lymph nodes. Thyroid gland, trachea, and esophagus demonstrate no significant findings.  Lungs/Pleura: 6.7 x 6.0 cm mass in the left upper lobe. Upper Abdomen: No acute abnormality. Musculoskeletal: Flowing anterior osteophytes along the lower thoracic spine. IMPRESSION: 1. A 6.7 x 6.0 cm mass in the left upper lobe. 2. Coronary artery and aortic Atherosclerosis (ICD10-I70.0). Electronically Signed   By: Deatra Robinson M.D.   On: 02/20/2020 01:23   DG Chest Port 1 View  Result Date: 02/19/2020 CLINICAL DATA:  Hypotensive altered EXAM: PORTABLE CHEST 1 VIEW COMPARISON:  None. FINDINGS: Low lung volumes. Rounded opacity in the left mid to upper lung. Enlarged cardiomediastinal silhouette. No pneumothorax. Ovoid opacity in the right hilus. IMPRESSION: Low lung volumes with large rounded opacity in the left mid to upper lung, pneumonia versus mass. Consider chest CT for further evaluation. Ovoid opacity in the right hilus could reflect vessel on end versus hilar node. This could also be assessed at CT. Electronically Signed   By: Jasmine Pang M.D.   On: 02/19/2020 23:02       LOS: 0 days   Tailer Volkert Rito Ehrlich  [facility] Pager on [email or web address]  02/20/2020, 10:50 AM

## 2020-02-20 NOTE — Progress Notes (Signed)
PT Cancellation Note  Patient Details Name: Joseph Potter MRN: [id number] DOB: 1945/08/20   Cancelled Treatment:    Reason Eval/Treat Not Completed: Other (comment);Patient's level of consciousness Spoke with OT who reports pt was confused, impulsive and difficult to keep on task.  Later spoke with nurse who reports that he continues to be quite confused and impulsive and is not going to be leaving today.  Will try to initiate PT tomorrow with the hope that he is more cognitively appropriate to work with PT.    Malachi Pro, DPT 02/20/2020, 4:32 PM

## 2020-02-20 NOTE — ED Notes (Signed)
Upon entering room patient noted to have pulled out INT, patient reports "I thought it was done", patient redirected. New IV placed.

## 2020-02-20 NOTE — ED Notes (Addendum)
Pt having visual hallucinations at this time. Pt used call light, when Rn entered room pt sitting up in bed attempting to get out of stretcher. When asked why he was attempting to get up pt states "im trying to call my dog laying right over there", and gestured to the floor. Pt asked "do you not see it". Pt informed no dog was present in room, and was easily redirected back into bed. Pt states " my wife has been telling me I do that a lot lately", referring to seeing things that are not there

## 2020-02-20 NOTE — ED Notes (Signed)
Patient noted to have pulled out INT again, patient calm and redirected.

## 2020-02-20 NOTE — ED Notes (Signed)
MD at bedside at this time.

## 2020-02-20 NOTE — H&P (Signed)
History and Physical    Joseph Potter ZHG:[id number] DOB: 06/28/1945 DOA: 02/19/2020  PCP: Pcp, No   Patient coming from: Home  I have personally briefly reviewed patient's old medical records in [facility]  Chief Complaint: Altered mental status  HPI: Joseph Potter is a 74 y.o. male with medical history significant for Hypertension and morbid obesity with history of frequent falls who was brought in by EMS who was initially called to the home for lift assist but on the evaluation patient was found to be hypotensive at 90/64.  Family reports that patient has been a bit confused for the past 24 hours.  He has had no fever or chills no cough or shortness of breath, no vomiting diarrhea and no complaints of abdominal pain or chest pain.  History limited due to mild confusion ED Course: By arrival to the emergency room he was normotensive at 114/73.  Afebrile with heart rate 98 O2 sat 95% on room air.  Blood work significant for WBC of 15,000 with lactic acid 3.3>>1.8 following treatment in the ER.  Hemoglobin 11.1, CMP unremarkable.  No baseline labs for comparison.  Patient gets his care at the [place]. EKG as reviewed by me : NSR at 90 with occasional PVCs and no acute ST-T wave changes Chest x-ray :large rounded opacity in the left mid to upper lung, pneumonia versus mass.  Follow-up CT chest showed A 6.7 x 6.0 cm mass in the left upper lobe.  Head CT showed no acute intracranial findings Patient treated with IV fluid bolus, antibiotics.  Hospitalist consulted for admission.  Review of Systems: As per HPI otherwise all other systems on review of systems negative.    History reviewed. No pertinent past medical history.  History reviewed. No pertinent surgical history.   reports that he has never smoked. He has never used smokeless tobacco. He reports previous alcohol use. He reports that he does not use drugs.  Not on File  History reviewed. No pertinent family history.    Prior to  Admission medications   Not on File    Physical Exam: Vitals:   02/19/20 2155 02/19/20 2157 02/19/20 2158 02/19/20 2342  BP:  114/73  (!) 144/79  Pulse:  98  81  Resp:  20  20  Temp: 98.6 F (37 C)     TempSrc: Oral     SpO2:  95%  93%  Weight:   (!) 172.4 kg   Height:   6' (1.829 m)      Vitals:   02/19/20 2155 02/19/20 2157 02/19/20 2158 02/19/20 2342  BP:  114/73  (!) 144/79  Pulse:  98  81  Resp:  20  20  Temp: 98.6 F (37 C)     TempSrc: Oral     SpO2:  95%  93%  Weight:   (!) 172.4 kg   Height:   6' (1.829 m)       Constitutional: Alert and oriented x 1 . Not in any apparent distress HEENT:      Head: Normocephalic and atraumatic.         Eyes: PERLA, EOMI, Conjunctivae are normal. Sclera is non-icteric.       Mouth/Throat: Mucous membranes are moist.       Neck: Supple with no signs of meningismus. Cardiovascular: Regular rate and rhythm. No murmurs, gallops, or rubs. 2+ symmetrical distal pulses are present . No JVD. No LE edema Respiratory: Respiratory effort normal .Lungs sounds clear bilaterally. No wheezes, crackles,  or rhonchi.  Gastrointestinal: Soft, non tender, and non distended with positive bowel sounds. No rebound or guarding. Genitourinary: No CVA tenderness. Musculoskeletal: Nontender with normal range of motion in all extremities. No cyanosis, or erythema of extremities. Neurologic:  Face is symmetric. Moving all extremities. No gross focal neurologic deficits . Skin: Skin is warm, dry.  No rash or ulcers Psychiatric: Mood and affect are normal    Labs on Admission: I have personally reviewed following labs and imaging studies  CBC: Recent Labs  Lab 02/19/20 2202  WBC 15.5*  NEUTROABS 11.9*  HGB 11.1*  HCT 33.4*  MCV 90.3  PLT 446*   Basic Metabolic Panel: Recent Labs  Lab 02/19/20 2202  NA 137  K 4.2  CL 101  CO2 23  GLUCOSE 164*  BUN 26*  CREATININE 1.24  CALCIUM 9.2   GFR: Estimated Creatinine Clearance: 88 mL/min  (by C-G formula based on SCr of 1.24 mg/dL). Liver Function Tests: Recent Labs  Lab 02/19/20 2202  AST 22  ALT 11  ALKPHOS 104  BILITOT 0.7  PROT 8.2*  ALBUMIN 2.8*   No results for input(s): LIPASE, AMYLASE in the last 168 hours. No results for input(s): AMMONIA in the last 168 hours. Coagulation Profile: No results for input(s): INR, PROTIME in the last 168 hours. Cardiac Enzymes: No results for input(s): CKTOTAL, CKMB, CKMBINDEX, TROPONINI in the last 168 hours. BNP (last 3 results) No results for input(s): PROBNP in the last 8760 hours. HbA1C: No results for input(s): HGBA1C in the last 72 hours. CBG: No results for input(s): GLUCAP in the last 168 hours. Lipid Profile: No results for input(s): CHOL, HDL, LDLCALC, TRIG, CHOLHDL, LDLDIRECT in the last 72 hours. Thyroid Function Tests: No results for input(s): TSH, T4TOTAL, FREET4, T3FREE, THYROIDAB in the last 72 hours. Anemia Panel: No results for input(s): VITAMINB12, FOLATE, FERRITIN, TIBC, IRON, RETICCTPCT in the last 72 hours. Urine analysis: No results found for: COLORURINE, APPEARANCEUR, LABSPEC, PHURINE, GLUCOSEU, HGBUR, BILIRUBINUR, KETONESUR, PROTEINUR, UROBILINOGEN, NITRITE, LEUKOCYTESUR  Radiological Exams on Admission: CT Head Wo Contrast  Result Date: 02/19/2020 CLINICAL DATA:  74 year old male with altered mental status. EXAM: CT HEAD WITHOUT CONTRAST TECHNIQUE: Contiguous axial images were obtained from the base of the skull through the vertex without intravenous contrast. COMPARISON:  None. FINDINGS: Brain: Mild age-related atrophy and chronic microvascular ischemic changes. There is no acute intracranial hemorrhage. No mass effect or midline shift no extra-axial fluid collection. Vascular: No hyperdense vessel or unexpected calcification. Skull: Normal. Negative for fracture or focal lesion. Sinuses/Orbits: No acute finding. Other: None IMPRESSION: No acute intracranial pathology. Electronically Signed   By:  Elgie Collard M.D.   On: 02/19/2020 23:10   CT Chest Wo Contrast  Result Date: 02/20/2020 CLINICAL DATA:  Chest pain EXAM: CT CHEST WITHOUT CONTRAST TECHNIQUE: Multidetector CT imaging of the chest was performed following the standard protocol without IV contrast. COMPARISON:  None. FINDINGS: Cardiovascular: Calcific aortic atherosclerosis. Coronary artery atherosclerosis. No pericardial effusion. Mediastinum/Nodes: No enlarged mediastinal or axillary lymph nodes. Thyroid gland, trachea, and esophagus demonstrate no significant findings. Lungs/Pleura: 6.7 x 6.0 cm mass in the left upper lobe. Upper Abdomen: No acute abnormality. Musculoskeletal: Flowing anterior osteophytes along the lower thoracic spine. IMPRESSION: 1. A 6.7 x 6.0 cm mass in the left upper lobe. 2. Coronary artery and aortic Atherosclerosis (ICD10-I70.0). Electronically Signed   By: Deatra Robinson M.D.   On: 02/20/2020 01:23   DG Chest Port 1 View  Result Date: 02/19/2020 CLINICAL DATA:  Hypotensive altered EXAM: PORTABLE CHEST 1 VIEW COMPARISON:  None. FINDINGS: Low lung volumes. Rounded opacity in the left mid to upper lung. Enlarged cardiomediastinal silhouette. No pneumothorax. Ovoid opacity in the right hilus. IMPRESSION: Low lung volumes with large rounded opacity in the left mid to upper lung, pneumonia versus mass. Consider chest CT for further evaluation. Ovoid opacity in the right hilus could reflect vessel on end versus hilar node. This could also be assessed at CT. Electronically Signed   By: Jasmine Pang M.D.   On: 02/19/2020 23:02     Assessment/Plan 74 year old male with history of hypertension and morbid obesity initially seen by EMS for lift assist with finding of low blood pressure 90/64 and altered mental status  Possible sepsis (HCC)   CAP (community acquired pneumonia) -Patient with leukocytosis of 15,000 and lactic acidosis of 3.3 responding to IV fluids.  Mildly tachycardic at 90, afebrile -Abnormality  on chest x-ray suggesting pneumonia with lung mass so possible postobstructive pneumonia -Unable to get urine specimen in the ER via clean-catch or catheterization so UTI not yet rule out -Got IV fluid bolus in the ER -Continue sepsis fluids -IV Rocephin and azithromycin -Follow-up UA  Lung mass on CT chest -6.7 x 6.0 cm mass left upper lobe on CT -Oncology consult either inpatient or outpatient    Acute metabolic encephalopathy -Family reported confusion, per EMS -Mild confusion but baseline uncertain patient A&O x2 -Fall and aspiration precautions    Morbid obesity with BMI of 50.0-59.9, adult (HCC) -This complicates overall prognosis and care    Essential hypertension -Hold home meds as patient was hypotensive with EMS and BP still somewhat soft  DVT prophylaxis: Lovenox  Code Status: full code  Family Communication:  none  Disposition Plan: Back to previous home environment Consults called: none  Status:At the time of admission, it appears that the appropriate admission status for this patient is INPATIENT. This is judged to be reasonable and necessary in order to provide the required intensity of service to ensure the patient's safety given the presenting symptoms, physical exam findings, and initial radiographic and laboratory data in the context of their  Comorbid conditions.   Patient requires inpatient status due to high intensity of service, high risk for further deterioration and high frequency of surveillance required.   I certify that at the point of admission it is my clinical judgment that the patient will require inpatient hospital care spanning beyond 2 midnights     Andris Baumann MD [facility]     02/20/2020, 1:22 AM

## 2020-02-21 ENCOUNTER — Inpatient Hospital Stay: Payer: No Typology Code available for payment source

## 2020-02-21 DIAGNOSIS — R918 Other nonspecific abnormal finding of lung field: Secondary | ICD-10-CM

## 2020-02-21 DIAGNOSIS — I10 Essential (primary) hypertension: Secondary | ICD-10-CM

## 2020-02-21 DIAGNOSIS — N390 Urinary tract infection, site not specified: Secondary | ICD-10-CM

## 2020-02-21 LAB — CBC
HCT: 28.9 % — ABNORMAL LOW (ref 39.0–52.0)
Hemoglobin: 9.8 g/dL — ABNORMAL LOW (ref 13.0–17.0)
MCH: 30.8 pg (ref 26.0–34.0)
MCHC: 33.9 g/dL (ref 30.0–36.0)
MCV: 90.9 fL (ref 80.0–100.0)
Platelets: 334 10*3/uL (ref 150–400)
RBC: 3.18 MIL/uL — ABNORMAL LOW (ref 4.22–5.81)
RDW: 14 % (ref 11.5–15.5)
WBC: 10.3 10*3/uL (ref 4.0–10.5)
nRBC: 0 % (ref 0.0–0.2)

## 2020-02-21 LAB — BASIC METABOLIC PANEL
Anion gap: 11 (ref 5–15)
BUN: 20 mg/dL (ref 8–23)
CO2: 24 mmol/L (ref 22–32)
Calcium: 8.8 mg/dL — ABNORMAL LOW (ref 8.9–10.3)
Chloride: 106 mmol/L (ref 98–111)
Creatinine, Ser: 1.06 mg/dL (ref 0.61–1.24)
GFR, Estimated: >60 mL/min (ref >60)
Glucose, Bld: 122 mg/dL — ABNORMAL HIGH (ref 70–99)
Potassium: 3.9 mmol/L (ref 3.5–5.1)
Sodium: 141 mmol/L (ref 135–145)

## 2020-02-21 LAB — APTT: aPTT: 44 seconds — ABNORMAL HIGH (ref 24–36)

## 2020-02-21 MED ORDER — CEFUROXIME AXETIL 500 MG PO TABS
500.0000 mg | ORAL_TABLET | Freq: Two times a day (BID) | ORAL | Status: DC
  Administered 2020-02-22: 500 mg via ORAL
  Filled 2020-02-21 (×3): qty 1

## 2020-02-21 MED ORDER — QUETIAPINE FUMARATE 25 MG PO TABS
25.0000 mg | ORAL_TABLET | Freq: Two times a day (BID) | ORAL | Status: DC
  Administered 2020-02-21 – 2020-02-22 (×3): 25 mg via ORAL
  Filled 2020-02-21 (×3): qty 1

## 2020-02-21 MED ORDER — HYDROCOD POLST-CPM POLST ER 10-8 MG/5ML PO SUER
5.0000 mL | Freq: Two times a day (BID) | ORAL | Status: DC | PRN
  Administered 2020-02-21 – 2020-02-22 (×3): 5 mL via ORAL
  Filled 2020-02-21 (×3): qty 5

## 2020-02-21 MED ORDER — IPRATROPIUM-ALBUTEROL 0.5-2.5 (3) MG/3ML IN SOLN
3.0000 mL | Freq: Four times a day (QID) | RESPIRATORY_TRACT | Status: DC | PRN
  Administered 2020-02-21 – 2020-02-25 (×4): 3 mL via RESPIRATORY_TRACT
  Filled 2020-02-21 (×4): qty 3

## 2020-02-21 NOTE — Progress Notes (Signed)
Patient was changed from 1:1 safety sitter to tele sitter 1300. Patient moved to low bed with mats. Patient tolerated transition well until 1730- patient repeatedly calling out, attempting to get out of bed, pulling off gown. Easy to re-orient, but at one point patients legs were already out of the bed, almost fell.  Offered bathroom, food, denied pain.  PRN breathing treatment given for wheezing. Behavior continued; RN administered Haldol IV.  Patient's behavior improved, required occasional redirection until about 1850.  Patient threw tray across room, was yelling "help" clothes were off, and trying to get out of bed.  Order from MD Rito Ehrlich to initiated 1:1 safety sitter- Charge RN aware.

## 2020-02-21 NOTE — NC FL2 (Signed)
[place] MEDICAID FL2 LEVEL OF CARE SCREENING TOOL     IDENTIFICATION  Patient Name: Joseph Potter Birthdate: 1946-04-06 Sex: male Admission Date (Current Location): 02/19/2020  [facility] and [place] Number:  Chiropodist and Address:         Provider Number: [id number]  Attending Physician Name and Address:  Osvaldo Shipper, MD  Relative Name and Phone Number:       Current Level of Care: Hospital Recommended Level of Care: Skilled Nursing Facility Prior Approval Number:    Date Approved/Denied:   PASRR Number: [id number]  Discharge Plan: SNF    Current Diagnoses: Patient Active Problem List   Diagnosis Date Noted  . Morbid obesity with BMI of 50.0-59.9, adult (HCC) 02/20/2020  . Sepsis (HCC) 02/20/2020  . CAP (community acquired pneumonia) 02/20/2020  . Mass of upper lobe of left lung 02/20/2020  . Essential hypertension 02/20/2020  . Acute metabolic encephalopathy 02/20/2020    Orientation RESPIRATION BLADDER Height & Weight     Self  Normal Continent Weight: (!) 172.4 kg Height:  6' (182.9 cm)  BEHAVIORAL SYMPTOMS/MOOD NEUROLOGICAL BOWEL NUTRITION STATUS      Continent Diet (Heart Healthy)  AMBULATORY STATUS COMMUNICATION OF NEEDS Skin   Extensive Assist Verbally Skin abrasions                       Personal Care Assistance Level of Assistance              Functional Limitations Info             SPECIAL CARE FACTORS FREQUENCY  PT (By licensed PT), OT (By licensed OT)                    Contractures Contractures Info: Not present    Additional Factors Info  Code Status, Allergies Code Status Info: DNR Allergies Info: NKDA           Current Medications (02/21/2020):  This is the current hospital active medication list Current Facility-Administered Medications  Medication Dose Route Frequency Provider Last Rate Last Admin  . acetaminophen (TYLENOL) tablet 650 mg  650 mg Oral Q6H PRN Andris Baumann, MD        Or  . acetaminophen (TYLENOL) suppository 650 mg  650 mg Rectal Q6H PRN Andris Baumann, MD      . allopurinol (ZYLOPRIM) tablet 100 mg  100 mg Oral BID Osvaldo Shipper, MD   100 mg at 02/21/20 1007  . amitriptyline (ELAVIL) tablet 50 mg  50 mg Oral QHS Osvaldo Shipper, MD   50 mg at 02/20/20 2101  . atorvastatin (LIPITOR) tablet 20 mg  20 mg Oral QHS Osvaldo Shipper, MD   20 mg at 02/20/20 2101  . azithromycin (ZITHROMAX) 500 mg in sodium chloride 0.9 % 250 mL IVPB  500 mg Intravenous Q24H Osvaldo Shipper, MD   Stopped at 02/21/20 0253  . cefTRIAXone (ROCEPHIN) 2 g in sodium chloride 0.9 % 100 mL IVPB  2 g Intravenous Q24H Lindajo Royal V, MD 200 mL/hr at 02/21/20 1002 2 g at 02/21/20 1002  . donepezil (ARICEPT) tablet 10 mg  10 mg Oral QHS Osvaldo Shipper, MD   10 mg at 02/20/20 2101  . haloperidol lactate (HALDOL) injection 2 mg  2 mg Intravenous Q6H PRN Osvaldo Shipper, MD   2 mg at 02/21/20 0253  . ipratropium-albuterol (DUONEB) 0.5-2.5 (3) MG/3ML nebulizer solution 3 mL  3 mL Nebulization Q6H PRN Osvaldo Shipper, MD  3 mL at 02/21/20 1057  . melatonin tablet 5 mg  5 mg Oral QHS Osvaldo Shipper, MD   5 mg at 02/20/20 2102  . metoprolol tartrate (LOPRESSOR) tablet 25 mg  25 mg Oral BID Osvaldo Shipper, MD   25 mg at 02/21/20 1007  . ondansetron (ZOFRAN) tablet 4 mg  4 mg Oral Q6H PRN Andris Baumann, MD       Or  . ondansetron [facility]) injection 4 mg  4 mg Intravenous Q6H PRN Andris Baumann, MD      . QUEtiapine (SEROQUEL) tablet 25 mg  25 mg Oral BID Osvaldo Shipper, MD   25 mg at 02/21/20 1007  . senna-docusate (Senokot-S) tablet 1 tablet  1 tablet Oral QHS PRN Andris Baumann, MD         Discharge Medications: Please see discharge summary for a list of discharge medications.  Relevant Imaging Results:  Relevant Lab Results:   Additional Information ss [id number]  Chapman Fitch, RN

## 2020-02-21 NOTE — TOC Initial Note (Signed)
Transition of Care [facility]) - Initial/Assessment Note    Patient Details  Name: Joseph Potter MRN: [id number] Date of Birth: 1945-11-10  [facility]) CM/SW Contact:    Chapman Fitch, RN Phone Number: 02/21/2020, 2:27 PM  Clinical Narrative:                 Patient admitted from home with sepsis  Patient alert to self Assessment completed with wife via phone  Patient lives at home with wife  PC[facility] Dr Leeanne Mannan at [facility]  Patient has WC, rw and standard walker in the home Wife states that [place] is working to get specialized lift arranged for the home  Patient was set up with [facility] prior to admission, however was admitted prior to being opened  Wife wishes is for patient to return home with home health services through [facility].  She is aware that patient is currently only ambulating 4 feet with PT.  She is agreeable to bed search incase patient is not appripriate to return home at discharge  PASRR obtaining Fl2 sent for signature Bedsearch initiated   Expected Discharge Plan: Home w Home Health Services Barriers to Discharge: Continued Medical Work up   Patient Goals and CMS Choice        Expected Discharge Plan and Services Expected Discharge Plan: Home w Home Health Services                                              Prior Living Arrangements/Services     Patient language and need for interpreter reviewed:: Yes Do you feel safe going back to the place where you live?: Yes      Need for Family Participation in Patient Care: Yes (Comment) Care giver support system in place?: Yes (comment) Current home services: DME Criminal Activity/Legal Involvement Pertinent to Current Situation/Hospitalization: No - Comment as needed  Activities of Daily Living      Permission Sought/Granted                  Emotional Assessment       Orientation: : Oriented to Self   Psych Involvement: No (comment)  Admission  diagnosis:  Sepsis (HCC) [A41.9] Altered mental status, unspecified altered mental status type [R41.82] Patient Active Problem List   Diagnosis Date Noted  . Morbid obesity with BMI of 50.0-59.9, adult (HCC) 02/20/2020  . Sepsis (HCC) 02/20/2020  . CAP (community acquired pneumonia) 02/20/2020  . Mass of upper lobe of left lung 02/20/2020  . Essential hypertension 02/20/2020  . Acute metabolic encephalopathy 02/20/2020   PCP:  Pcp, No Pharmacy:   [facility] - [place], [place] - [address] [place] [address] Phone: [phone] Fax: [phone]     Social Determinants of Health (SDOH) Interventions    Readmission Risk Interventions No flowsheet data found.

## 2020-02-21 NOTE — Progress Notes (Addendum)
[facility] PROGRESS NOTE   Joseph Potter TKP:[id number] DOB: 1946/04/16 DOA: 02/19/2020  PCP: Pcp, No  Brief History/Interval Summary: 74 y.o. male with medical history significant for Hypertension and morbid obesity with history of frequent falls who was brought in by EMS who was initially called to the home for lift assist but on the evaluation patient was found to be hypotensive at 90/64.  Family reports that patient has been a bit confused for the past 24 hours.  He has had no fever or chills no cough or shortness of breath, no vomiting diarrhea and no complaints of abdominal pain or chest pain.  History limited due to mild confusion.  In the emergency department patient was noted to be normotensive.  He was noted to have leukocytosis.  Chest x-ray showed a large rounded opacity in the left lung.  Hospitalized for acute encephalopathy likely due to infection.  Reason for Visit: Acute metabolic encephalopathy.  Urinary tract infection  Consultants: None yet  Procedures: None yet  Antibiotics: Anti-infectives (From admission, onward)   Start     Dose/Rate Route Frequency Ordered Stop   02/20/20 0800  cefTRIAXone (ROCEPHIN) 2 g in sodium chloride 0.9 % 100 mL IVPB        2 g 200 mL/hr over 30 Minutes Intravenous Every 24 hours 02/20/20 0121     02/20/20 0200  azithromycin (ZITHROMAX) 500 mg in sodium chloride 0.9 % 250 mL IVPB        500 mg 250 mL/hr over 60 Minutes Intravenous Every 24 hours 02/20/20 0121     02/19/20 2315  ceFEPIme (MAXIPIME) 2 g in sodium chloride 0.9 % 100 mL IVPB        2 g 200 mL/hr over 30 Minutes Intravenous  Once 02/19/20 2313 02/20/20 0007   02/19/20 2315  metroNIDAZOLE (FLAGYL) IVPB 500 mg        500 mg 100 mL/hr over 60 Minutes Intravenous  Once 02/19/20 2313 02/20/20 0055   02/19/20 2315  vancomycin (VANCOCIN) IVPB 1000 mg/200 mL premix        1,000 mg 200 mL/hr over 60 Minutes Intravenous  Once 02/19/20 2313 02/20/20 0131       Subjective/Interval History: Overnight events noted.  Required haldol overnight for agitation.  Patient remains confused this morning.  Not much history is available from him at this time.     Assessment/Plan:  Acute metabolic encephalopathy in the setting of known history of dementia According to his wife patient has confusion issues at baseline.  This is been ongoing for 6 months.  He follows at the [place].  Plan is for full home health services starting soon.  Patient's wife does not want him to go to skilled nursing facility. It is quite possible that his acute worsening in mentation is likely due to UTI.  No focal deficits noted.  CT head did not show any acute findings.  Continue to redirect.  Haldol as needed.  May need to use Seroquel.  QTc normal.   Yesterday patient wanted to leave the hospital.  His wife unable to care for him in his current state.  She is waiting for home health services to be established.  Patient was given Haldol with improvement in his agitation.  Urinary tract infection Noted to be afebrile with normal heart rate normal respiratory rate and normal saturations.  No evidence for sepsis.  Sepsis ruled out.   Follow-up on urine cultures.  WBC noted to be normal today.   Continue  ceftriaxone.  Also on azithromycin due to initial concern for pneumonia.    Left lung mass This was noted on CT scan.  Patient has a history of tobacco abuse.  He states that he smoked more than 2 to 3 packs of cigarettes daily but has been quit for about 15 to 20 years.  History is unreliable.  Patient will need tissue diagnosis.   However based on information available from wife regarding his history of dementia unclear if he is a candidate for any kind of intervention.  Will discuss with his wife today.  Patient is followed at the [place].  May need to pursue this in that facility. He is not stable currently to undergo any kind of work-up at this time. Discussed with the patient's wife.   Apparently patient and his wife have had this discussion previously regarding what to do if patient is diagnosed with or is thought to have malignancy.  Patient had expressed to his wife that he would not want any further testing or procedures done.  Patient's wife also does not want to pursue any testing at this point.  She would like to discuss this issue further with his providers at the [place] once the patient is back to his baseline. CODE STATUS was also discussed with the patient's wife.  We will change him to DNR based on his previous wishes.  Essential hypertension Patient was hypotensive initially.  Pressures stabilized.  High blood pressures yesterday most likely due to agitation.  Noted to be stable this morning.  Holding his antihypertensives for now.    Morbid obesity Estimated body mass index is 51.54 kg/m as calculated from the following:   Height as of this encounter: 6' (1.829 m).   Weight as of this encounter: 172.4 kg.   DVT Prophylaxis: Lovenox Code Status: DNR Family Communication: Discussed with wife yesterday.  Will do so again today. Disposition Plan: Hopefully return home when improved.  PT and OT evaluation.  Status is: Inpatient  Remains inpatient appropriate because:Altered mental status, IV treatments appropriate due to intensity of illness or inability to take PO and Inpatient level of care appropriate due to severity of illness   Dispo: The patient is from: Home              Anticipated d/c is to: Home              Anticipated d/c date is: 2 days              Patient currently is not medically stable to d/c.     Medications:  Scheduled: . allopurinol  100 mg Oral BID  . amitriptyline  50 mg Oral QHS  . atorvastatin  20 mg Oral QHS  . donepezil  10 mg Oral QHS  . enoxaparin (LOVENOX) injection  0.5 mg/kg Subcutaneous Q24H  . melatonin  5 mg Oral QHS  . metoprolol tartrate  25 mg Oral BID   Continuous: . azithromycin Stopped (02/21/20 0253)  .  cefTRIAXone (ROCEPHIN)  IV Stopped (02/20/20 4132)   GMW:NUUVOZDGUYQIH **OR** acetaminophen, haloperidol lactate, ondansetron **OR** ondansetron (ZOFRAN) IV, senna-docusate   Objective:  Vital Signs  Vitals:   02/20/20 2200 02/20/20 2334 02/21/20 0541 02/21/20 0807  BP: 138/82 111/66 124/76 130/73  Pulse: 78 76 67 62  Resp:  20 18 15   Temp:  98.6 F (37 C) 98 F (36.7 C) 97.8 F (36.6 C)  TempSrc:  Oral Oral Oral  SpO2: 95% 96% 94% 91%  Weight:  Height:        Intake/Output Summary (Last 24 hours) at 02/21/2020 0937 Last data filed at 02/21/2020 0513 Gross per 24 hour  Intake 349.6 ml  Output 500 ml  Net -150.4 ml   Filed Weights   02/19/20 2158  Weight: (!) 172.4 kg    General appearance: Slightly somnolent but easily arousable.  Remains confused.   Resp: Clear to auscultation bilaterally.  Normal effort Cardio: S1-S2 is normal regular.  No S3-S4.  No rubs murmurs or bruit GI: Abdomen is soft.  Nontender nondistended.  Bowel sounds are present normal.  No masses organomegaly Extremities: No edema.  Moving all his extremities Neurologic: Oriented to city.  Did not know why he was in the hospital.  Did not know the year or the month.  No focal deficits noted.     Lab Results:  Data Reviewed: I have personally reviewed following labs and imaging studies  CBC: Recent Labs  Lab 02/19/20 2202 02/20/20 0458 02/21/20 0536  WBC 15.5* 13.4* 10.3  NEUTROABS 11.9*  --   --   HGB 11.1* 10.8* 9.8*  HCT 33.4* 32.3* 28.9*  MCV 90.3 91.5 90.9  PLT 446* 386 334    Basic Metabolic Panel: Recent Labs  Lab 02/19/20 2202 02/20/20 0458 02/21/20 0536  NA 137 136 141  K 4.2 4.8 3.9  CL 101 103 106  CO2 23 21* 24  GLUCOSE 164* 145* 122*  BUN 26* 23 20  CREATININE 1.24 1.23 1.06  CALCIUM 9.2 8.8* 8.8*    GFR: Estimated Creatinine Clearance: 102.9 mL/min (by C-G formula based on SCr of 1.06 mg/dL).  Liver Function Tests: Recent Labs  Lab 02/19/20 2202    AST 22  ALT 11  ALKPHOS 104  BILITOT 0.7  PROT 8.2*  ALBUMIN 2.8*    Coagulation Profile: Recent Labs  Lab 02/20/20 0458  INR 1.1      Recent Results (from the past 240 hour(s))  Blood culture (single)     Status: None (Preliminary result)   Collection Time: 02/19/20 11:30 PM   Specimen: BLOOD  Result Value Ref Range Status   Specimen Description BLOOD LEFT ANTECUBITAL  Final   Special Requests   Final    BOTTLES DRAWN AEROBIC AND ANAEROBIC Blood Culture adequate volume   Culture   Final    NO GROWTH 2 DAYS Performed at [facility], [address] [place], [place] [address]    Report Status PENDING  Incomplete  Respiratory Panel by RT PCR (Flu A&B, Covid) - Nasopharyngeal Swab     Status: None   Collection Time: 02/20/20  4:58 AM   Specimen: Nasopharyngeal Swab  Result Value Ref Range Status   SARS Coronavirus 2 by RT PCR NEGATIVE NEGATIVE Final    Comment: (NOTE) SARS-CoV-2 target nucleic acids are NOT DETECTED.  The SARS-CoV-2 RNA is generally detectable in upper respiratoy specimens during the acute phase of infection. The lowest concentration of SARS-CoV-2 viral copies this assay can detect is 131 copies/mL. A negative result does not preclude SARS-Cov-2 infection and should not be used as the sole basis for treatment or other patient management decisions. A negative result may occur with  improper specimen collection/handling, submission of specimen other than nasopharyngeal swab, presence of viral mutation(s) within the areas targeted by this assay, and inadequate number of viral copies (<131 copies/mL). A negative result must be combined with clinical observations, patient history, and epidemiological information. The expected result is Negative.  Fact Sheet for Patients:  https://www.moore.com/  Fact Sheet for Healthcare Providers:  https://www.young.biz/  This test is no t yet approved or cleared by  the [place] FDA and  has been authorized for detection and/or diagnosis of SARS-CoV-2 by FDA under an Emergency Use Authorization (EUA). This EUA will remain  in effect (meaning this test can be used) for the duration of the COVID-19 declaration under Section 564(b)(1) of the Act, 21 U.S.C. section 360bbb-3(b)(1), unless the authorization is terminated or revoked sooner.     Influenza A by PCR NEGATIVE NEGATIVE Final   Influenza B by PCR NEGATIVE NEGATIVE Final    Comment: (NOTE) The Xpert Xpress SARS-CoV-2/FLU/RSV assay is intended as an aid in  the diagnosis of influenza from Nasopharyngeal swab specimens and  should not be used as a sole basis for treatment. Nasal washings and  aspirates are unacceptable for Xpert Xpress SARS-CoV-2/FLU/RSV  testing.  Fact Sheet for Patients: https://www.moore.com/  Fact Sheet for Healthcare Providers: https://www.young.biz/  This test is not yet approved or cleared by the [place] FDA and  has been authorized for detection and/or diagnosis of SARS-CoV-2 by  FDA under an Emergency Use Authorization (EUA). This EUA will remain  in effect (meaning this test can be used) for the duration of the  Covid-19 declaration under Section 564(b)(1) of the Act, 21  U.S.C. section 360bbb-3(b)(1), unless the authorization is  terminated or revoked. Performed at [facility], [address] [place], [place] [address]       Radiology Studies: CT Head Wo Contrast  Result Date: 02/19/2020 CLINICAL DATA:  74 year old male with altered mental status. EXAM: CT HEAD WITHOUT CONTRAST TECHNIQUE: Contiguous axial images were obtained from the base of the skull through the vertex without intravenous contrast. COMPARISON:  None. FINDINGS: Brain: Mild age-related atrophy and chronic microvascular ischemic changes. There is no acute intracranial hemorrhage. No mass effect or midline shift no extra-axial fluid  collection. Vascular: No hyperdense vessel or unexpected calcification. Skull: Normal. Negative for fracture or focal lesion. Sinuses/Orbits: No acute finding. Other: None IMPRESSION: No acute intracranial pathology. Electronically Signed   By: Elgie Collard M.D.   On: 02/19/2020 23:10   CT Chest Wo Contrast  Result Date: 02/20/2020 CLINICAL DATA:  Chest pain EXAM: CT CHEST WITHOUT CONTRAST TECHNIQUE: Multidetector CT imaging of the chest was performed following the standard protocol without IV contrast. COMPARISON:  None. FINDINGS: Cardiovascular: Calcific aortic atherosclerosis. Coronary artery atherosclerosis. No pericardial effusion. Mediastinum/Nodes: No enlarged mediastinal or axillary lymph nodes. Thyroid gland, trachea, and esophagus demonstrate no significant findings. Lungs/Pleura: 6.7 x 6.0 cm mass in the left upper lobe. Upper Abdomen: No acute abnormality. Musculoskeletal: Flowing anterior osteophytes along the lower thoracic spine. IMPRESSION: 1. A 6.7 x 6.0 cm mass in the left upper lobe. 2. Coronary artery and aortic Atherosclerosis (ICD10-I70.0). Electronically Signed   By: Deatra Robinson M.D.   On: 02/20/2020 01:23   DG Chest Port 1 View  Result Date: 02/19/2020 CLINICAL DATA:  Hypotensive altered EXAM: PORTABLE CHEST 1 VIEW COMPARISON:  None. FINDINGS: Low lung volumes. Rounded opacity in the left mid to upper lung. Enlarged cardiomediastinal silhouette. No pneumothorax. Ovoid opacity in the right hilus. IMPRESSION: Low lung volumes with large rounded opacity in the left mid to upper lung, pneumonia versus mass. Consider chest CT for further evaluation. Ovoid opacity in the right hilus could reflect vessel on end versus hilar node. This could also be assessed at CT. Electronically Signed   By: Jasmine Pang M.D.   On:  02/19/2020 23:02       LOS: 1 day   Osvaldo Shipper  [facility] Pager on [email or web address]  02/21/2020, 9:37 AM

## 2020-02-21 NOTE — Progress Notes (Signed)
Loud expiratory wheezes heard at bedside and bilaterally with stethoscope.  Respirations even and nonlabored, 20/minute, SPO2 91%.  Denies SOB, does not appear to be in distress.  Contacted Attending MD, Rito Ehrlich.  Orders for CXR and PRN Duoneb.  Contacted respiratory to administer PRN Duoneb.

## 2020-02-21 NOTE — Evaluation (Signed)
Physical Therapy Evaluation Patient Details Name: Joseph Potter MRN: [id number] DOB: 06-21-1945 Today's Date: 02/21/2020   History of Present Illness  74y.o. male with PMHx of HTN, obesity, DM, frequent falls who was brought in by EMS who was initially called to the home for lift assist but on the evaluation patient was found to be hypotensive at 90/64.  Wife reports pt has been confused for the past 24 hours. Negative for fever, chills, cough, vomiting, diarrhea, and abdominal pain or chest pain. In ED, pt was noted to have leukocytosis. Chest x-ray showed a large rounded opacity in the left lung. Hospitalized for acute encephalopathy.  Clinical Impression  Pt with lethargy (likely due to some meds) but more compliant and willing to participate.  He struggled to stay fully engaged with PT exam and but did show effort with mobility, transfers and standing.  He did not know date, situation and gave conflicting PLOF/home situation but with simple commands and moderate encouragement he did do some activity.  Pt was able to tolerate standing and managed a few shuffling side steps and minimal forward/back ambulation but is not safe or aware enough to tolerate more prolonged bout of ambulation.  Per today's performance he will not be safe to return home and will require STR once medically ready for d/c.     Follow Up Recommendations SNF;Supervision/Assistance - 24 hour    Equipment Recommendations   (TBD)    Recommendations for Other Services       Precautions / Restrictions Precautions Precautions: Fall Restrictions Weight Bearing Restrictions: No      Mobility  Bed Mobility Overal bed mobility: Needs Assistance Bed Mobility: Rolling;Sit to Supine;Supine to Sit Rolling: Min assist   Supine to sit: Min assist Sit to supine: Mod assist        Transfers Overall transfer level: Needs assistance Equipment used: Rolling walker (2 wheeled) (bariatric) Transfers: Sit to/from Stand Sit  to Stand: Min assist;+2 physical assistance            Ambulation/Gait Ambulation/Gait assistance: Min assist;+2 physical assistance Gait Distance (Feet): 4 Feet Assistive device: Rolling walker (2 wheeled)       General Gait Details: Pt was able to manage a few side steps along EOB and one bout of a few forward/backward steps.  He did not have any LOBs but was highly reliant on the walker and struggled with timely responses to cuing  Stairs            Wheelchair Mobility    Modified Rankin (Stroke Patients Only)       Balance Overall balance assessment: Needs assistance Sitting-balance support: Feet supported;Bilateral upper extremity supported Sitting balance-Leahy Scale: Fair Sitting balance - Comments: Pt able to maintain sitting balance relatively well with UE use   Standing balance support: Bilateral upper extremity supported Standing balance-Leahy Scale: Fair Standing balance comment: highly reliant on the walker, mental status seemed to be limiter but no overt LOBs                             Pertinent Vitals/Pain      Home Living Family/patient expects to be discharged to:: Private residence Living Arrangements: Spouse/significant other Available Help at Discharge: Family;Available 24 hours/day Type of Home: House Home Access: Ramped entrance (per prev notes, today states he lives on 3rd floor apartment)     Home Layout: One level        Prior Function Level of Independence:  Needs assistance   Gait / Transfers Assistance Needed: Pt reports he walks with a cane, is able to get out of the home regularly, unsure as to the veracity of his report  ADL's / Homemaking Assistance Needed: Wife does all driving, shopping, and cooking; she assists pt with dressing and bathing        Hand Dominance        Extremity/Trunk Assessment   Upper Extremity Assessment Upper Extremity Assessment: Generalized weakness (L shoulder elevation <50,  grossly 2+/5, R grossly 3/5 t/o)    Lower Extremity Assessment Lower Extremity Assessment: Generalized weakness (grossly 3- to 3+/5 t/o, no again gravity leg lifts)       Communication   Communication: No difficulties  Cognition Arousal/Alertness: Suspect due to medications;Lethargic Behavior During Therapy: Impulsive Overall Cognitive Status: Impaired/Different from baseline Area of Impairment: Orientation;Attention;Safety/judgement;Following commands;Problem solving                                      General Comments      Exercises     Assessment/Plan    PT Assessment Patient needs continued PT services  PT Problem List Decreased strength;Decreased range of motion;Decreased activity tolerance;Decreased balance;Decreased mobility;Decreased knowledge of use of DME;Decreased safety awareness       PT Treatment Interventions DME instruction;Gait training;Stair training;Functional mobility training;Therapeutic activities;Therapeutic exercise;Balance training;Cognitive remediation;Patient/family education;Neuromuscular re-education    PT Goals (Current goals can be found in the Care Plan section)  Acute Rehab PT Goals Patient Stated Goal: go home, but seemed to acknowledge that he will need STR PT Goal Formulation: With patient Time For Goal Achievement: 03/06/20 Potential to Achieve Goals: Fair    Frequency Min 2X/week   Barriers to discharge        Co-evaluation               AM-PAC PT "6 Clicks" Mobility  Outcome Measure Help needed turning from your back to your side while in a flat bed without using bedrails?: A Little Help needed moving from lying on your back to sitting on the side of a flat bed without using bedrails?: A Lot Help needed moving to and from a bed to a chair (including a wheelchair)?: A Lot Help needed standing up from a chair using your arms (e.g., wheelchair or bedside chair)?: A Little Help needed to walk in hospital  room?: A Lot Help needed climbing 3-5 steps with a railing? : Total 6 Click Score: 13    End of Session Equipment Utilized During Treatment: Gait belt Activity Tolerance: Patient tolerated treatment well;Patient limited by lethargy Patient left: with bed alarm set;with nursing/sitter in room Nurse Communication: Mobility status PT Visit Diagnosis: Muscle weakness (generalized) (M62.81);Difficulty in walking, not elsewhere classified (R26.2)    Time: 1749-4496 PT Time Calculation (min) (ACUTE ONLY): 26 min   Charges:   PT Evaluation $PT Eval Low Complexity: 1 Low PT Treatments $Therapeutic Activity: 8-22 mins        Malachi Pro, DPT 02/21/2020, 9:51 AM

## 2020-02-21 NOTE — TOC Initial Note (Signed)
Transition of Care [facility]) - Initial/Assessment Note    Patient Details  Name: Joseph Potter MRN: [id number] Date of Birth: 13-Feb-1946  Transition [facility]) CM/SW Contact:    Chapman Fitch, RN Phone Number: 02/21/2020, 10:58 AM  Clinical Narrative:                  PT recommending SNF Patient confused with sitter at bedside Voicemail left for wife to discuss recommendations        Patient Goals and CMS Choice        Expected Discharge Plan and Services                                                Prior Living Arrangements/Services                       Activities of Daily Living      Permission Sought/Granted                  Emotional Assessment              Admission diagnosis:  Sepsis (HCC) [A41.9] Altered mental status, unspecified altered mental status type [R41.82] Patient Active Problem List   Diagnosis Date Noted  . Morbid obesity with BMI of 50.0-59.9, adult (HCC) 02/20/2020  . Sepsis (HCC) 02/20/2020  . CAP (community acquired pneumonia) 02/20/2020  . Mass of upper lobe of left lung 02/20/2020  . Essential hypertension 02/20/2020  . Acute metabolic encephalopathy 02/20/2020   PCP:  Pcp, No Pharmacy:   [facility] - [place], [place] - [address] [place] [address] Phone: [phone] Fax: [phone]     Social Determinants of Health (SDOH) Interventions    Readmission Risk Interventions No flowsheet data found.

## 2020-02-21 NOTE — Evaluation (Addendum)
Clinical/Bedside Swallow Evaluation Patient Details  Name: Joseph Potter MRN: [id number] Date of Birth: 09-01-1945  Today's Date: 02/21/2020 Time: SLP Start Time (ACUTE ONLY): 1240 SLP Stop Time (ACUTE ONLY): 1320 SLP Time Calculation (min) (ACUTE ONLY): 40 min  Past Medical History: History reviewed. No pertinent past medical history. Past Surgical History: History reviewed. No pertinent surgical history. HPI:  Pt is a 74 y.o. male with medical history significant for Hypertension and morbid Obesity with history of frequent falls who was brought in by EMS who was initially called to the home for lift assist but on the evaluation patient was found to be hypotensive at 90/64.  Family reports that patient has been more confused for the past 24 hours.  He has had no fever or chills no cough or shortness of breath, no vomiting diarrhea and no complaints of abdominal pain or chest pain.  History limited due to confusion, mental status w/ hallucinations. Pt admitted w/ Acute metabolic encephalopathy, urinary tract infection. Unsure of pt's Baseline Cognitive status and its impact on current presentation during illness. Pt has a L Lung mass per Chest CT, Oncology consult recommended per MD.   Assessment / Plan / Recommendation Clinical Impression  Pt was seen bedside for bedside swallow evaluation. Pt appears at reduced risk for aspiration following General Aspiration Precautions-- no Overt Clinical s/s of aspiration/dysphagia noted. Per chart (10/27), pt has baseline of "mild confusion", and is exhibiting "impulsivity" and "visual hallucinations". Pt was awake, alert & cooperative throughout session. Sitter was present throughout. Distractions were reduced as possible; pt required verbal cues to attend to, and follow through w/, po tasks as he fed self w/ setup given.  Oral Mechanism Exam revealed adequate lingual strength & ROM; adequate labial seal-- lingual ROM min uncoordinated possibly d/t baseline  cognitive disorganization/confusion -- difficulty following through w/ instructions. Pt missing some dentition -- pt reports he does not have dentures. During trials of 5x ice chips via spoon, 10x+ thin liquid via straw, 10x puree via spoon, and 8x softened solids via spoon, no overt clinical s/s of aspiration or dysphagia were noted. Pt was able to self feed puree & thin liquid consistencies w/ set up assist. Pt required full assist for upright positioning in bed and tray set-up. Oral phase appears adequate for timely A-P transfer of bolus, bolus management, mastication, and complete oral clearing. No pharyngeal s/s of dysphagia/aspiration noted. Vocal quality was Clear during/between all PO's; no decline in respiratory effort noted. Min verbal cues were required to remind pt to orient to task, and engage safe swallowing strategies of alternating foods/liquids and take small bites of food. Pt exhibited min impulsivity w/ bolus size when self-feeding ice cream, however he reduced bolus size appropriately when given verbal cues.    Pt appears to present w/ adequate oropharyngeal swallowing function and at/near his baseline for swallowing. Recommend Age-Appropriate Regular Diet w/ Thin Liquids. Emphasis on safe swallowing strategies of reducing distractions, taking small bites, and verbal cues to remind pt to orient to task of eating/drinking. Recommend full supervision at mealtime currently for safer swallowing given pt's baseline h/o confusion, and "visual hallucinations" per chart. Straws OK. NSG/MD updated. NSG to reconsult if further swallowing issues arise. ST services to sign off at this time.    SLP Visit Diagnosis: Dysphagia, unspecified (R13.10)    Aspiration Risk  No limitations; reduced following general aspiration precautions   Diet Recommendation   Regular diet w/ thin liquids. General aspiration precautions. Monitoring during meals for follow through w/  precautions -- reduce distractions at  meal time.   Medication Administration: Crushed with puree vs Whole as pt tolerates w/ NSG   Other  Recommendations Oral Care Recommendations: Oral care BID   Follow up Recommendations None      Frequency and Duration  (n/a)          Prognosis Prognosis for Safe Diet Advancement: Good Barriers to Reach Goals: Cognitive deficits;Time post onset;Behavior      Swallow Study   General Date of Onset: 02/19/20 HPI: Pt is a 74 y.o. male with medical history significant for Hypertension and morbid obesity with history of frequent falls who was brought in by EMS who was initially called to the home for lift assist but on the evaluation patient was found to be hypotensive at 90/64.  Family reports that patient has been a bit confused for the past 24 hours.  He has had no fever or chills no cough or shortness of breath, no vomiting diarrhea and no complaints of abdominal pain or chest pain.  History limited due to mild confusion. Type of Study: Bedside Swallow Evaluation Previous Swallow Assessment: no Diet Prior to this Study: Regular;Thin liquids (heart healthy) Temperature Spikes Noted: No Respiratory Status: Room air History of Recent Intubation: No Behavior/Cognition: Alert;Cooperative;Pleasant mood Oral Cavity Assessment: Within Functional Limits Oral Care Completed by SLP: No Oral Cavity - Dentition: Poor condition;Missing dentition Vision: Functional for self-feeding Self-Feeding Abilities: Able to feed self;Needs set up Patient Positioning: Upright in bed Baseline Vocal Quality: Normal    Oral/Motor/Sensory Function Overall Oral Motor/Sensory Function: Within functional limits   Ice Chips Ice chips: Within functional limits Presentation: Spoon Other Comments: 5x   Thin Liquid Thin Liquid: Within functional limits Presentation: Self Fed;Straw Other Comments: 10x    Nectar Thick Nectar Thick Liquid: Not tested   Honey Thick Honey Thick Liquid: Not tested   Puree Puree:  Within functional limits Presentation: Self Fed;Spoon Other Comments: 10x   Solid     Solid: Within functional limits Presentation: Self Fed;Spoon Other Comments: 10x      Oliver Pila  Graduate Clinician 02/21/2020,3:00 PM   The information in this patient note, response to treatment, and overall treatment plan developed has been reviewed and agreed upon by this clinician.  Jerilynn Som, MS, CCC-SLP Speech Language Pathologist Rehab Services [phone]

## 2020-02-21 NOTE — Progress Notes (Signed)
MD Notified that patient has expiratory wheezes audible at bedside, denies SOB, no signs of distress, SPO2 93%.  Order for Duoneb and CXR.  RN contacts RT to administer first duoneb treatment

## 2020-02-22 DIAGNOSIS — Z5181 Encounter for therapeutic drug level monitoring: Secondary | ICD-10-CM

## 2020-02-22 DIAGNOSIS — Z515 Encounter for palliative care: Secondary | ICD-10-CM

## 2020-02-22 DIAGNOSIS — Z7189 Other specified counseling: Secondary | ICD-10-CM

## 2020-02-22 LAB — CBC
HCT: 29.6 % — ABNORMAL LOW (ref 39.0–52.0)
Hemoglobin: 9.9 g/dL — ABNORMAL LOW (ref 13.0–17.0)
MCH: 30.7 pg (ref 26.0–34.0)
MCHC: 33.4 g/dL (ref 30.0–36.0)
MCV: 91.9 fL (ref 80.0–100.0)
Platelets: 346 10*3/uL (ref 150–400)
RBC: 3.22 MIL/uL — ABNORMAL LOW (ref 4.22–5.81)
RDW: 13.9 % (ref 11.5–15.5)
WBC: 9.8 10*3/uL (ref 4.0–10.5)
nRBC: 0 % (ref 0.0–0.2)

## 2020-02-22 LAB — BASIC METABOLIC PANEL
Anion gap: 10 (ref 5–15)
BUN: 21 mg/dL (ref 8–23)
CO2: 24 mmol/L (ref 22–32)
Calcium: 9 mg/dL (ref 8.9–10.3)
Chloride: 105 mmol/L (ref 98–111)
Creatinine, Ser: 1.08 mg/dL (ref 0.61–1.24)
GFR, Estimated: >60 mL/min (ref >60)
Glucose, Bld: 159 mg/dL — ABNORMAL HIGH (ref 70–99)
Potassium: 3.8 mmol/L (ref 3.5–5.1)
Sodium: 139 mmol/L (ref 135–145)

## 2020-02-22 MED ORDER — HALOPERIDOL LACTATE 5 MG/ML IJ SOLN
2.0000 mg | Freq: Once | INTRAMUSCULAR | Status: AC
  Administered 2020-02-22: 2 mg via INTRAVENOUS
  Filled 2020-02-22: qty 1

## 2020-02-22 MED ORDER — LORAZEPAM 2 MG/ML IJ SOLN
1.0000 mg | Freq: Once | INTRAMUSCULAR | Status: AC
  Administered 2020-02-22: 1 mg via INTRAMUSCULAR
  Filled 2020-02-22: qty 1

## 2020-02-22 MED ORDER — QUETIAPINE FUMARATE 25 MG PO TABS
50.0000 mg | ORAL_TABLET | Freq: Two times a day (BID) | ORAL | Status: DC
  Administered 2020-02-22 – 2020-02-24 (×4): 50 mg via ORAL
  Filled 2020-02-22 (×4): qty 2

## 2020-02-22 MED ORDER — CEFUROXIME AXETIL 500 MG PO TABS
500.0000 mg | ORAL_TABLET | Freq: Two times a day (BID) | ORAL | Status: DC
  Administered 2020-02-22: 500 mg via ORAL
  Filled 2020-02-22 (×3): qty 1

## 2020-02-22 MED ORDER — QUETIAPINE FUMARATE 25 MG PO TABS
25.0000 mg | ORAL_TABLET | Freq: Once | ORAL | Status: AC
  Administered 2020-02-22: 25 mg via ORAL
  Filled 2020-02-22: qty 1

## 2020-02-22 NOTE — Progress Notes (Signed)
Civil engineer, contracting hospital Liaison note:  New referral for [facility] hospice services at home received form TOC [address].  Patient information sent to referral. Hospice eligibility pending review. Writer to follow up with family on Monday as per TOC no plan for d/c over the weekend. Thank you for the opportunity to be involved in the care of this patient and his family.  Dayna Barker BSN, RN, [facility] [employer] [phone]

## 2020-02-22 NOTE — Care Management Important Message (Signed)
Important Message  Patient Details  Name: Joseph Potter MRN: [id number] Date of Birth: [birth year]   Medicare Important Message Given:  N/A - LOS <3 / Initial given by admissions  Initial Medicare IM reviewed with spouse, Juanetta Beets by Jennye Moccasin, Patient Access Associate on 02/21/2020 at 11:25am.   [name] 02/22/2020, 8:21 AM

## 2020-02-22 NOTE — Consult Note (Signed)
Consultation Note Date: 02/22/2020   Patient Name: Joseph Potter  DOB: 1946-03-21  MRN: [id number]  Age / Sex: 74 y.o., male  PCP: Pcp, No Referring Physician: Osvaldo Shipper, MD  Reason for Consultation: Establishing goals of care  HPI/Patient Profile: 74 year old male with history of hypertension and morbid obesity initially seen by EMS for lift assist with finding of low blood pressure 90/64 and altered mental status.  Clinical Assessment and Goals of Care: Patient is resting in bed with son at bedside. Patient is confused. When asked were he is, he said where the earth goes around the moon. He said Fidela Juneau is the president, and then said Obama.   Spoke with wife and son. They state he has PTSD([place])  at baseline as well as dementia. He is conversive, but forgets in the afternoon the events of the morning.   We discussed his diagnoses, prognosis, GOC, EOL wishes disposition and options.  A detailed discussion was had today regarding advanced directives.  Concepts specific to code status, artifical feeding and hydration, IV antibiotics and rehospitalization were discussed.  The difference between an aggressive medical intervention path and a comfort care path was discussed.  Values and goals of care important to patient and family were attempted to be elicited.  Discussed limitations of medical interventions to prolong quality of life in some situations and discussed the concept of human mortality.  They are clear that conversations have taken place previously discussing what to do in various scenerios. They state he would not want CPR, ventilator support, feeding tube, or surgery. Wife states they do not want work up nor treatment for the lung mass.  The family states they want to medically optimize him including treating his infection, and bring him home to focus on comfort with hospice care.         Will f/u Monday.   SUMMARY OF RECOMMENDATIONS   DNR/DNI.  Medically optimize for D/C home with hospice.   Prognosis:   < 6 months Poor PO intake, infection, delirium, dementia, lung mass, skin breakdown.       Primary Diagnoses: Present on Admission: **None**   I have reviewed the medical record, interviewed the patient and family, and examined the patient. The following aspects are pertinent.  History reviewed. No pertinent past medical history. Social History   Socioeconomic History  . Marital status: Married    Spouse name: Not on file  . Number of children: Not on file  . Years of education: Not on file  . Highest education level: Not on file  Occupational History  . Not on file  Tobacco Use  . Smoking status: Never Smoker  . Smokeless tobacco: Never Used  Substance and Sexual Activity  . Alcohol use: Not Currently  . Drug use: Never  . Sexual activity: Not on file  Other Topics Concern  . Not on file  Social History Narrative  . Not on file   Social Determinants of Health   Financial Resource Strain:   . Difficulty of Paying  Living Expenses: Not on file  Food Insecurity:   . Worried About Programme researcher, broadcasting/film/video in the Last Year: Not on file  . Ran Out of Food in the Last Year: Not on file  Transportation Needs:   . Lack of Transportation (Medical): Not on file  . Lack of Transportation (Non-Medical): Not on file  Physical Activity:   . Days of Exercise per Week: Not on file  . Minutes of Exercise per Session: Not on file  Stress:   . Feeling of Stress : Not on file  Social Connections:   . Frequency of Communication with Friends and Family: Not on file  . Frequency of Social Gatherings with Friends and Family: Not on file  . Attends Religious Services: Not on file  . Active Member of Clubs or Organizations: Not on file  . Attends Banker Meetings: Not on file  . Marital Status: Not on file   History reviewed. No pertinent family  history. Scheduled Meds: . allopurinol  100 mg Oral BID  . amitriptyline  50 mg Oral QHS  . atorvastatin  20 mg Oral QHS  . cefUROXime  500 mg Oral Q12H  . donepezil  10 mg Oral QHS  . melatonin  5 mg Oral QHS  . metoprolol tartrate  25 mg Oral BID  . QUEtiapine  50 mg Oral BID   Continuous Infusions: PRN Meds:.acetaminophen **OR** acetaminophen, chlorpheniramine-HYDROcodone, haloperidol lactate, ipratropium-albuterol, ondansetron **OR** ondansetron (ZOFRAN) IV, senna-docusate Medications Prior to Admission:  Prior to Admission medications   Medication Sig Start Date End Date Taking? Authorizing Provider  acetaminophen (TYLENOL) 325 MG tablet Take 650 mg by mouth every 6 (six) hours as needed.   Yes [provider]  allopurinol (ZYLOPRIM) 100 MG tablet Take 100 mg by mouth 2 (two) times daily.   Yes [provider]  amitriptyline (ELAVIL) 25 MG tablet Take 50 mg by mouth at bedtime.   Yes [provider]  amLODipine (NORVASC) 10 MG tablet Take 5 mg by mouth daily.   Yes [provider]  atorvastatin (LIPITOR) 40 MG tablet Take 20 mg by mouth daily.   Yes [provider]  bacitracin 500 UNIT/GM ointment Apply 1 application topically 2 (two) times daily.   Yes [provider]  cetirizine (ZYRTEC) 10 MG tablet Take 10 mg by mouth daily.   Yes [provider]  cholecalciferol (VITAMIN D) 25 MCG (1000 UNIT) tablet Take 1,000 Units by mouth daily.   Yes [provider]  colchicine 0.6 MG tablet Take 0.6 mg by mouth as directed.   Yes [provider]  donepezil (ARICEPT) 10 MG tablet Take 10 mg by mouth at bedtime.   Yes [provider]  ketoconazole (NIZORAL) 2 % shampoo Apply 1 application topically 2 (two) times a week.   Yes [provider]  lisinopril (ZESTRIL) 40 MG tablet Take 40 mg by mouth daily.   Yes [provider]  magnesium oxide (MAG-OX) 400 MG tablet Take 400 mg by mouth  daily.   Yes [provider]  melatonin 3 MG TABS tablet Take 3 mg by mouth at bedtime.   Yes [provider]  metFORMIN (GLUCOPHAGE) 1000 MG tablet Take 1,000 mg by mouth 2 (two) times daily with a meal.   Yes [provider]  metoprolol tartrate (LOPRESSOR) 25 MG tablet Take 25 mg by mouth 2 (two) times daily.   Yes [provider]  naproxen (NAPROSYN) 500 MG tablet Take 500 mg  by mouth 2 (two) times daily with a meal.   Yes [provider]  Skin Protectants, Misc. (NO STING BARRIER FILM) MISC Apply 1 each topically 2 (two) times daily.   Yes [provider]  triamcinolone ointment (KENALOG) 0.1 % Apply 1 application topically 2 (two) times daily.   Yes [provider]   No Known Allergies Review of Systems  All other systems reviewed and are negative.   Physical Exam Pulmonary:     Effort: Pulmonary effort is normal.  Neurological:     Mental Status: He is alert.     Vital Signs: BP (!) 135/47 (BP Location: Left Arm)   Pulse 68   Temp 98 F (36.7 C) (Oral)   Resp 16   Ht 6' (1.829 m)   Wt (!) 172.4 kg   SpO2 93%   BMI 51.54 kg/m  Pain Scale: 0-10   Pain Score: 0-No pain   SpO2: SpO2: 93 % O2 Device:SpO2: 93 % O2 Flow Rate: .   IO: Intake/output summary:   Intake/Output Summary (Last 24 hours) at 02/22/2020 1552 Last data filed at 02/22/2020 1429 Gross per 24 hour  Intake 440 ml  Output 200 ml  Net 240 ml    LBM: Last BM Date:  (unknown) Baseline Weight: Weight: (!) 172.4 kg Most recent weight: Weight: (!) 172.4 kg     Palliative Assessment/Data:     Time In: 3:30 Time Out: 4:15 Time Total: 45 min Greater than 50%  of this time was spent counseling and coordinating care related to the above assessment and plan.  Signed by: Morton Stall, NP   Please contact Palliative Medicine Team phone at [phone] for questions and concerns.  For individual provider: See  Loretha Stapler

## 2020-02-22 NOTE — Plan of Care (Signed)
Continuing with plan of care. 

## 2020-02-22 NOTE — TOC Progression Note (Addendum)
Transition of Care [facility]) - Progression Note    Patient Details  Name: Joseph Potter MRN: [id number] Date of Birth: 02-08-1946  [facility] CM/SW Contact  Candie Chroman, LCSW Phone Number: 02/22/2020, 3:32 PM  Clinical Narrative: No SNF bed offers so far.  4:28 pm: Per palliative NP, plan for home with hospice. CSW met with wife. Preference is for [facility] in the event he needs to transition to the hospice house. Patient already has a wheelchair and walker at home. [facility] that he can sit on the floor in and be lifted up. Wife thinks he will benefit from a bedside commode. She does not want a hospital bed yet. CSW called and made referral to Flo Shanks, RN with [facility].  Expected Discharge Plan: [place] Barriers to Discharge: Continued Medical Work up  Expected Discharge Plan and Services Expected Discharge Plan: [place]                                               Social Determinants of Health (SDOH) Interventions    Readmission Risk Interventions No flowsheet data found.

## 2020-02-22 NOTE — Progress Notes (Signed)
[facility] PROGRESS NOTE   Joseph Potter GEZ:[id number] DOB: 08-10-1945 DOA: 02/19/2020  PCP: Pcp, No  Brief History/Interval Summary: 74 y.o. male with medical history significant for Hypertension and morbid obesity with history of frequent falls who was brought in by EMS who was initially called to the home for lift assist but on the evaluation patient was found to be hypotensive at 90/64.  Family reports that patient has been a bit confused for the past 24 hours.  He has had no fever or chills no cough or shortness of breath, no vomiting diarrhea and no complaints of abdominal pain or chest pain.  History limited due to mild confusion.  In the emergency department patient was noted to be normotensive.  He was noted to have leukocytosis.  Chest x-ray showed a large rounded opacity in the left lung.  Hospitalized for acute encephalopathy likely due to infection.  Reason for Visit: Acute metabolic encephalopathy.  Urinary tract infection  Consultants: None yet  Procedures: None yet  Antibiotics: Anti-infectives (From admission, onward)   Start     Dose/Rate Route Frequency Ordered Stop   02/22/20 0800  cefUROXime (CEFTIN) tablet 500 mg        500 mg Oral 2 times daily with meals 02/21/20 1502     02/20/20 0800  cefTRIAXone (ROCEPHIN) 2 g in sodium chloride 0.9 % 100 mL IVPB        2 g 200 mL/hr over 30 Minutes Intravenous Every 24 hours 02/20/20 0121 02/21/20 1032   02/20/20 0200  azithromycin (ZITHROMAX) 500 mg in sodium chloride 0.9 % 250 mL IVPB        500 mg 250 mL/hr over 60 Minutes Intravenous Every 24 hours 02/20/20 0121 02/22/20 0206   02/19/20 2315  ceFEPIme (MAXIPIME) 2 g in sodium chloride 0.9 % 100 mL IVPB        2 g 200 mL/hr over 30 Minutes Intravenous  Once 02/19/20 2313 02/20/20 0007   02/19/20 2315  metroNIDAZOLE (FLAGYL) IVPB 500 mg        500 mg 100 mL/hr over 60 Minutes Intravenous  Once 02/19/20 2313 02/20/20 0055   02/19/20 2315  vancomycin (VANCOCIN)  IVPB 1000 mg/200 mL premix        1,000 mg 200 mL/hr over 60 Minutes Intravenous  Once 02/19/20 2313 02/20/20 0131      Subjective/Interval History: Overnight events noted.  Patient with episodes of agitation every so often requiring Haldol.  Patient remains confused this morning though not agitated.  Sitter is at the bedside.     Assessment/Plan:  Acute metabolic encephalopathy in the setting of known history of dementia with behavioral disturbances According to his wife patient has confusion issues at baseline.  Has been diagnosed with dementia.  This is been ongoing for 6 months.  He follows at the [place].  Plan is for full home health services starting soon. It is possible that his acute worsening in mentation could be due to UTI.  No focal deficits noted.  CT head did not show any acute findings.  However it is also possible that this could be progression of his dementia.  Patient's mentation has not really improved much in the last 48 hours despite improvement in his WBC with antibacterials.  Continue Haldol as needed.  Patient was started on Seroquel yesterday.  We will increase the dose today.  QTC was noted to be normal.  Will recheck EKG. Seen by PT and OT.  Skilled nursing facility is recommended.  Discussions ongoing with patient's wife who has been reluctant to consider SNF previously.  Urinary tract infection Noted to be afebrile with normal heart rate normal respiratory rate and normal saturations.  No evidence for sepsis.  Sepsis ruled out.   2000 colonies of gram-negative rods.  Waiting on final identification and sensitivities.  WBC noted to be normal.  Patient changed over to cefuroxime.  Azithromycin was discontinued.    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  Patient has a history of tobacco abuse.  He states that he smoked more than 2 to 3 packs of cigarettes daily but has been quit for about 15 to 20 years.  History is unreliable.    Patient will need tissue diagnosis.  This is likely some kind of malignancy. However based on information available from wife regarding his history of dementia unclear if he is a candidate for any kind of intervention.  Will discuss with his wife today.  Patient is followed at the [place].  May need to pursue this in that facility. He is not stable currently to undergo any kind of work-up at this time. Discussed with the patient's wife.  Apparently patient and his wife have had this discussion previously regarding what to do if patient is diagnosed with or is thought to have malignancy.  Patient had expressed to his wife that he would not want any further testing or procedures done.  Patient's wife also does not want to pursue any testing at this point.  She would like to discuss this issue further with his providers at the [facility] once the patient is back to his baseline. May benefit from a palliative care consultation.  Essential hypertension Patient was hypotensive initially.  Issues have stabilized.  Holding his antihypertensives for now.     Normocytic anemia Drop in hemoglobin dilutional.  No evidence of overt bleeding.  Continue to monitor periodically.  Morbid obesity Estimated body mass index is 51.54 kg/m as calculated from the following:   Height as of this encounter: 6' (1.829 m).   Weight as of this encounter: 172.4 kg.   DVT Prophylaxis: Lovenox Code Status: CODE STATUS was changed over to DNR based on discussion with his wife.   Family Communication: Discussed with wife on a daily basis. Disposition Plan: Unclear.  Might need to go to SNF.  Status is: Inpatient  Remains inpatient appropriate because:Altered mental status, IV treatments appropriate due to intensity of illness or inability to take PO and Inpatient level of care appropriate due to severity of illness   Dispo: The patient is from: Home              Anticipated d/c is to: Home versus SNF              Anticipated d/c  date is: 2 days              Patient currently is not medically stable to d/c.     Medications:  Scheduled: . allopurinol  100 mg Oral BID  . amitriptyline  50 mg Oral QHS  . atorvastatin  20 mg Oral QHS  . cefUROXime  500 mg Oral BID WC  . donepezil  10 mg Oral QHS  . melatonin  5 mg Oral QHS  . metoprolol tartrate  25 mg Oral BID  . QUEtiapine  25 mg Oral BID   Continuous:  VOH:[id number] **OR** acetaminophen, chlorpheniramine-HYDROcodone, haloperidol lactate, ipratropium-albuterol, ondansetron **OR** ondansetron (ZOFRAN) IV, senna-docusate  Objective:  Vital Signs  Vitals:   02/21/20 1959 02/21/20 2313 02/22/20 0323 02/22/20 0814  BP: (!) 129/92 (!) 147/72 128/63 (!) 135/47  Pulse: 66 64 62 68  Resp: 18 18 20 16   Temp: 98.4 F (36.9 C) 98 F (36.7 C) 98.6 F (37 C) 98 F (36.7 C)  TempSrc: Oral Oral Oral Oral  SpO2: 93% 92% 91% 93%  Weight:      Height:        Intake/Output Summary (Last 24 hours) at 02/22/2020 0936 Last data filed at 02/22/2020 0650 Gross per 24 hour  Intake 320 ml  Output 200 ml  Net 120 ml   Filed Weights   02/19/20 2158  Weight: (!) 172.4 kg    General appearance: Patient is awake alert.  Confused and distracted. Resp: Clear to auscultation bilaterally.  Normal effort Cardio: S1-S2 is normal regular.  No S3-S4.  No rubs murmurs or bruit GI: Abdomen is soft.  Nontender nondistended.  Bowel sounds are present normal.  No masses organomegaly Extremities: No edema.  Full range of motion of lower extremities. Neurologic: Disoriented. No focal neurological deficits.      Lab Results:  Data Reviewed: I have personally reviewed following labs and imaging studies  CBC: Recent Labs  Lab 02/19/20 2202 02/20/20 0458 02/21/20 0536 02/22/20 0524  WBC 15.5* 13.4* 10.3 9.8  NEUTROABS 11.9*  --   --   --   HGB 11.1* 10.8* 9.8* 9.9*  HCT 33.4* 32.3* 28.9* 29.6*  MCV 90.3 91.5 90.9 91.9  PLT 446* 386 334 346    Basic  Metabolic Panel: Recent Labs  Lab 02/19/20 2202 02/20/20 0458 02/21/20 0536 02/22/20 0524  NA 137 136 141 139  K 4.2 4.8 3.9 3.8  CL 101 103 106 105  CO2 23 21* 24 24  GLUCOSE 164* 145* 122* 159*  BUN 26* 23 20 21   CREATININE 1.24 1.23 1.06 1.08  CALCIUM 9.2 8.8* 8.8* 9.0    GFR: Estimated Creatinine Clearance: 101 mL/min (by C-G formula based on SCr of 1.08 mg/dL).  Liver Function Tests: Recent Labs  Lab 02/19/20 2202  AST 22  ALT 11  ALKPHOS 104  BILITOT 0.7  PROT 8.2*  ALBUMIN 2.8*    Coagulation Profile: Recent Labs  Lab 02/20/20 0458  INR 1.1      Recent Results (from the past 240 hour(s))  Blood culture (single)     Status: None (Preliminary result)   Collection Time: 02/19/20 11:30 PM   Specimen: BLOOD  Result Value Ref Range Status   Specimen Description BLOOD LEFT ANTECUBITAL  Final   Special Requests   Final    BOTTLES DRAWN AEROBIC AND ANAEROBIC Blood Culture adequate volume   Culture   Final    NO GROWTH 3 DAYS Performed at [facility], [address] [place], [address] [place]    Report Status PENDING  Incomplete  Urine culture     Status: Abnormal (Preliminary result)   Collection Time: 02/20/20  2:19 AM   Specimen: In/Out Cath Urine  Result Value Ref Range Status   Specimen Description   Final    IN/OUT CATH URINE Performed at [facility], [address] [place], [address] [place]    Special Requests   Final    NONE Performed at [facility], [address], [place], [address] [place]    Culture 60,000 COLONIES/mL GRAM NEGATIVE RODS (A)  Final   Report Status PENDING  Incomplete  Respiratory Panel  by RT PCR (Flu A&B, Covid) - Nasopharyngeal Swab     Status: None   Collection Time: 02/20/20  4:58 AM   Specimen: Nasopharyngeal Swab  Result Value Ref Range Status   SARS Coronavirus 2 by RT PCR NEGATIVE NEGATIVE Final    Comment: (NOTE) SARS-CoV-2 target nucleic acids are NOT DETECTED.  The  SARS-CoV-2 RNA is generally detectable in upper respiratoy specimens during the acute phase of infection. The lowest concentration of SARS-CoV-2 viral copies this assay can detect is 131 copies/mL. A negative result does not preclude SARS-Cov-2 infection and should not be used as the sole basis for treatment or other patient management decisions. A negative result may occur with  improper specimen collection/handling, submission of specimen other than nasopharyngeal swab, presence of viral mutation(s) within the areas targeted by this assay, and inadequate number of viral copies (<131 copies/mL). A negative result must be combined with clinical observations, patient history, and epidemiological information. The expected result is Negative.  Fact Sheet for Patients:  https://www.moore.com/https://www.fda.gov/media/142436/download  Fact Sheet for Healthcare Providers:  https://www.young.biz/https://www.fda.gov/media/142435/download  This test is no t yet approved or cleared by the [place] FDA and  has been authorized for detection and/or diagnosis of SARS-CoV-2 by FDA under an Emergency Use Authorization (EUA). This EUA will remain  in effect (meaning this test can be used) for the duration of the COVID-19 declaration under Section 564(b)(1) of the Act, 21 U.S.C. section 360bbb-3(b)(1), unless the authorization is terminated or revoked sooner.     Influenza A by PCR NEGATIVE NEGATIVE Final   Influenza B by PCR NEGATIVE NEGATIVE Final    Comment: (NOTE) The Xpert Xpress SARS-CoV-2/FLU/RSV assay is intended as an aid in  the diagnosis of influenza from Nasopharyngeal swab specimens and  should not be used as a sole basis for treatment. Nasal washings and  aspirates are unacceptable for Xpert Xpress SARS-CoV-2/FLU/RSV  testing.  Fact Sheet for Patients: https://www.moore.com/https://www.fda.gov/media/142436/download  Fact Sheet for Healthcare Providers: https://www.young.biz/https://www.fda.gov/media/142435/download  This test is not yet approved or cleared  by the [place] FDA and  has been authorized for detection and/or diagnosis of SARS-CoV-2 by  FDA under an Emergency Use Authorization (EUA). This EUA will remain  in effect (meaning this test can be used) for the duration of the  Covid-19 declaration under Section 564(b)(1) of the Act, 21  U.S.C. section 360bbb-3(b)(1), unless the authorization is  terminated or revoked. Performed at [facility], [address] [place], [place] [address]       Radiology Studies: DG Chest Port 1 View  Result Date: 02/21/2020 CLINICAL DATA:  CT chest 02/20/2020 EXAM: PORTABLE CHEST 1 VIEW COMPARISON:  CT chest 02/20/2020, chest radiograph 02/19/2020. FINDINGS: Similar mass in the left upper lobe, better characterized on recent CT chest. Low lung volumes without new consolidation. No visible pleural effusions or pneumothorax. Similar cardiomediastinal contour. Aortic atherosclerosis. No acute osseous abnormality. IMPRESSION: Similar mass in the left upper lobe, better characterized on recent CT chest. No new acute abnormality. Electronically Signed   By: Feliberto HartsFrederick S Jones MD   On: 02/21/2020 10:53       LOS: 2 days   Osvaldo ShipperGokul Francella Barnett  [facility] Pager on [email or web address]  02/22/2020, 9:36 AM

## 2020-02-23 DIAGNOSIS — F0391 Unspecified dementia with behavioral disturbance: Secondary | ICD-10-CM

## 2020-02-23 LAB — BASIC METABOLIC PANEL
Anion gap: 9 (ref 5–15)
BUN: 16 mg/dL (ref 8–23)
CO2: 27 mmol/L (ref 22–32)
Calcium: 9.2 mg/dL (ref 8.9–10.3)
Chloride: 104 mmol/L (ref 98–111)
Creatinine, Ser: 0.93 mg/dL (ref 0.61–1.24)
GFR, Estimated: >60 mL/min (ref >60)
Glucose, Bld: 148 mg/dL — ABNORMAL HIGH (ref 70–99)
Potassium: 3.8 mmol/L (ref 3.5–5.1)
Sodium: 140 mmol/L (ref 135–145)

## 2020-02-23 LAB — CBC
HCT: 32.6 % — ABNORMAL LOW (ref 39.0–52.0)
Hemoglobin: 10.6 g/dL — ABNORMAL LOW (ref 13.0–17.0)
MCH: 30 pg (ref 26.0–34.0)
MCHC: 32.5 g/dL (ref 30.0–36.0)
MCV: 92.4 fL (ref 80.0–100.0)
Platelets: 361 10*3/uL (ref 150–400)
RBC: 3.53 MIL/uL — ABNORMAL LOW (ref 4.22–5.81)
RDW: 13.8 % (ref 11.5–15.5)
WBC: 10.1 10*3/uL (ref 4.0–10.5)
nRBC: 0 % (ref 0.0–0.2)

## 2020-02-23 LAB — URINE CULTURE: Culture: 60000 — AB

## 2020-02-23 MED ORDER — FOSFOMYCIN TROMETHAMINE 3 G PO PACK
3.0000 g | PACK | Freq: Once | ORAL | Status: AC
  Administered 2020-02-23: 3 g via ORAL
  Filled 2020-02-23: qty 3

## 2020-02-23 NOTE — Progress Notes (Signed)
Physical Therapy Treatment Patient Details Name: Joseph Potter MRN: [id number] DOB: 1945/12/10 Today's Date: 02/23/2020    History of Present Illness 74y.o. male with PMHx of HTN, obesity, DM, frequent falls who was brought in by EMS who was initially called to the home for lift assist but on the evaluation patient was found to be hypotensive at 90/64.  Wife reports pt has been confused for the past 24 hours. Negative for fever, chills, cough, vomiting, diarrhea, and abdominal pain or chest pain. In ED, pt was noted to have leukocytosis. Chest x-ray showed a large rounded opacity in the left lung. Hospitalized for acute encephalopathy.    PT Comments    Pt was supine in bed with sitter at bedside. Sitter reports pt has been very lethargic all day. He is able to answer questions but keeps eyes closed throughout. Constant vcs for interactive and keeping eyes open. Pt is oriented to self only. +2 assist for all mobility for safety. Max assist to achieve EOB sitting via log roll L. Pt unsafe to attempt to stand 2/2 to falling asleep/lethargy in sitting. Highly recommend DC to SNF to address deficits. If family takes pt home, will need lift, w/c, specialty hospital bed, and all [facility] services. Pt's lethargy and cognition deficits limits his ability.     Follow Up Recommendations  SNF     Equipment Recommendations  Other (comment);Wheelchair (measurements PT);Wheelchair cushion (measurements PT);Hospital bed (defer to next level of care. If pt DC home will need lift )    Recommendations for Other Services       Precautions / Restrictions Precautions Precautions: Fall Restrictions Weight Bearing Restrictions: No    Mobility  Bed Mobility Overal bed mobility: Needs Assistance Bed Mobility: Rolling;Sit to Supine;Supine to Sit Rolling: Max assist;+2 for safety/equipment   Supine to sit: Max assist;+2 for safety/equipment Sit to supine: Max assist;+2 for safety/equipment   General bed  mobility comments: pt required max assist to achieve sitting EOB. performed roll L to short sit with max assist + second person for safety  Transfers      General transfer comment: unsafe to attemp standing 2/2 to lethargy. He has difficulty staying awake in sitting      Balance Overall balance assessment: Needs assistance Sitting-balance support: Feet supported;Bilateral upper extremity supported Sitting balance-Leahy Scale: Fair Sitting balance - Comments: Min assist at first progressing to close SBA however pt falling asleep in sitting. max assis to return to supine         Cognition Arousal/Alertness: Lethargic;Suspect due to medications Behavior During Therapy:  (lethargic) Overall Cognitive Status: Impaired/Different from baseline Area of Impairment: Orientation;Attention;Safety/judgement;Following commands;Problem solving    Orientation Level: Disoriented to;Place;Time;Situation     Following Commands: Follows one step commands inconsistently Safety/Judgement: Decreased awareness of safety;Decreased awareness of deficits   Problem Solving: Slow processing;Decreased initiation;Difficulty sequencing;Requires verbal cues;Requires tactile cues General Comments: pt is very lethargic throughout session. minimal time with eyes open however pt does answer questions inconsistently             Pertinent Vitals/Pain Pain Assessment: No/denies pain           PT Goals (current goals can now be found in the care plan section) Acute Rehab PT Goals Patient Stated Goal: none stated Progress towards PT goals: Not progressing toward goals - comment (lethargy)    Frequency    Min 2X/week      PT Plan Current plan remains appropriate       AM-PAC PT "6 Clicks"  Mobility   Outcome Measure  Help needed turning from your back to your side while in a flat bed without using bedrails?: A Lot Help needed moving from lying on your back to sitting on the side of a flat bed  without using bedrails?: A Lot Help needed moving to and from a bed to a chair (including a wheelchair)?: A Lot Help needed standing up from a chair using your arms (e.g., wheelchair or bedside chair)?: A Lot Help needed to walk in hospital room?: Total Help needed climbing 3-5 steps with a railing? : Total 6 Click Score: 10    End of Session   Activity Tolerance: Patient limited by lethargy Patient left: with bed alarm set;with nursing/sitter in room Nurse Communication: Mobility status PT Visit Diagnosis: Muscle weakness (generalized) (M62.81);Difficulty in walking, not elsewhere classified (R26.2)     Time: 8889-1694 PT Time Calculation (min) (ACUTE ONLY): 19 min  Charges:  $Therapeutic Activity: 8-22 mins                     Jetta Lout PTA 02/23/20, 12:42 PM

## 2020-02-23 NOTE — Plan of Care (Signed)
Continuing with plan of care. 

## 2020-02-23 NOTE — Progress Notes (Addendum)
[facility] PROGRESS NOTE   Joseph Potter ZOX:[id number] DOB: 1945/09/28 DOA: 02/19/2020  PCP: Pcp, No  Brief History/Interval Summary: 74 y.o. male with medical history significant for Hypertension and morbid obesity with history of frequent falls who was brought in by EMS who was initially called to the home for lift assist but on the evaluation patient was found to be hypotensive at 90/64.  Family reports that patient has been a bit confused for the past 24 hours.  He has had no fever or chills no cough or shortness of breath, no vomiting diarrhea and no complaints of abdominal pain or chest pain.  History limited due to mild confusion.  In the emergency department patient was noted to be normotensive.  He was noted to have leukocytosis.  Chest x-ray showed a large rounded opacity in the left lung.  Hospitalized for acute encephalopathy likely due to infection.  Reason for Visit: Acute metabolic encephalopathy.  Urinary tract infection  Consultants: None yet  Procedures: None yet  Antibiotics: Anti-infectives (From admission, onward)   Start     Dose/Rate Route Frequency Ordered Stop   02/22/20 2200  cefUROXime (CEFTIN) tablet 500 mg        500 mg Oral Every 12 hours 02/22/20 1346     02/22/20 0800  cefUROXime (CEFTIN) tablet 500 mg  Status:  Discontinued        500 mg Oral 2 times daily with meals 02/21/20 1502 02/22/20 1346   02/20/20 0800  cefTRIAXone (ROCEPHIN) 2 g in sodium chloride 0.9 % 100 mL IVPB        2 g 200 mL/hr over 30 Minutes Intravenous Every 24 hours 02/20/20 0121 02/21/20 1032   02/20/20 0200  azithromycin (ZITHROMAX) 500 mg in sodium chloride 0.9 % 250 mL IVPB        500 mg 250 mL/hr over 60 Minutes Intravenous Every 24 hours 02/20/20 0121 02/22/20 0206   02/19/20 2315  ceFEPIme (MAXIPIME) 2 g in sodium chloride 0.9 % 100 mL IVPB        2 g 200 mL/hr over 30 Minutes Intravenous  Once 02/19/20 2313 02/20/20 0007   02/19/20 2315  metroNIDAZOLE (FLAGYL)  IVPB 500 mg        500 mg 100 mL/hr over 60 Minutes Intravenous  Once 02/19/20 2313 02/20/20 0055   02/19/20 2315  vancomycin (VANCOCIN) IVPB 1000 mg/200 mL premix        1,000 mg 200 mL/hr over 60 Minutes Intravenous  Once 02/19/20 2313 02/20/20 0131      Subjective/Interval History: Patient noted to be sleeping this morning.  Easily arousable.  No complaints offered.  Remains confused.    Assessment/Plan:  Acute metabolic encephalopathy in the setting of known history of dementia with behavioral disturbances According to his wife patient has confusion issues at baseline.  Has been diagnosed with dementia.  This is been ongoing for 6 months.  He follows at the [place].  Plan is for full home health services starting soon. It is possible that his acute worsening in mentation could could have been due to UTI.  No focal deficits were noted.  CT head did not show any acute findings.   Patient mentation has not improved much despite treatment with antibiotics.  So it is likely that this is all due to progression of his dementia.  Patient was started on Seroquel for mood stabilization.  Dose was increased yesterday.  Patient continues to have episodes of agitation.  Continue Haldol as needed.  QTC was noted to be normal. Patient was seen by PT and OT.  Skilled nursing facility was recommended..  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  Palliative care was consulted.  He is likely eligible for hospice services as his life expectancy is thought to be 6 months or less.  I think for him the best case scenario would be to go back home with his wife with hospice services at home.  TOC and hospice is following.  Appreciate palliative care assistance.  Urinary tract infection Noted to be afebrile with normal heart rate normal respiratory rate and normal saturations.  No evidence for sepsis.  Sepsis ruled  out.  Urine culture with 60,000 colonies of Pseudomonas.  Sensitivities reviewed.  Patient was on ceftriaxone and was changed over to cefuroxime.  WBC is normal.  He is afebrile.  We will give him 1 dose of fosfomycin.  Stop the cefuroxime.  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  Patient has a history of tobacco abuse.  Smoked more than 2 to 3 packs of cigarettes daily but has been quit for about 15 to 20 years.   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  Will need biopsy for definitive diagnosis.  This was discussed with the patient's wife.  Considering his progressive dementia he is not be a candidate for further testing and treatment.  She mentioned that the patient would not want any further testing based on previous conversations. Pursuing hospice at this time.  Essential hypertension Patient was hypotensive initially.  Blood pressure stabilized.  Occasional high readings could be due to agitation.  Continue to hold his antihypertensives.   Normocytic anemia Drop in hemoglobin dilutional.  No evidence of overt bleeding.  Continue to monitor periodically.  Morbid obesity Estimated body mass index is 51.54 kg/m as calculated from the following:   Height as of this encounter: 6' (1.829 m).   Weight as of this encounter: 172.4 kg.   DVT Prophylaxis: Lovenox Code Status: DNR Family Communication: Discussed with wife on a daily basis. Disposition Plan: Unclear.  Might be able to go home with hospice services.  Status is: Inpatient  Remains inpatient appropriate because:Altered mental status, IV treatments appropriate due to intensity of illness or inability to take PO and Inpatient level of care appropriate due to severity of illness   Dispo: The patient is from: Home              Anticipated d/c is to: Home versus SNF              Anticipated d/c date is: 2 days              Patient currently is not medically stable to d/c.     Medications:    Scheduled: . allopurinol  100 mg Oral BID  . amitriptyline  50 mg Oral QHS  . atorvastatin  20 mg Oral QHS  . cefUROXime  500 mg Oral Q12H  . donepezil  10 mg Oral QHS  . melatonin  5 mg Oral QHS  . metoprolol tartrate  25 mg Oral BID  . QUEtiapine  50 mg Oral BID   Continuous:  DVV:OHYWVPXTGGYIR **OR** acetaminophen, chlorpheniramine-HYDROcodone, haloperidol lactate, ipratropium-albuterol, ondansetron **OR** ondansetron (ZOFRAN) IV, senna-docusate   Objective:  Vital Signs  Vitals:   02/22/20 0323 02/22/20 0814 02/22/20 2022 02/23/20 0500  BP:  128/63 (!) 135/47 (!) 162/75 (!) 157/78  Pulse: 62 68 63 (!) 53  Resp: 20 16 17 16   Temp: 98.6 F (37 C) 98 F (36.7 C) 98.1 F (36.7 C) 98 F (36.7 C)  TempSrc: Oral Oral Oral   SpO2: 91% 93% 93% 97%  Weight:      Height:        Intake/Output Summary (Last 24 hours) at 02/23/2020 1010 Last data filed at 02/22/2020 2300 Gross per 24 hour  Intake 480 ml  Output 700 ml  Net -220 ml   Filed Weights   02/19/20 2158  Weight: (!) 172.4 kg    General appearance: Somnolent but easily arousable.  No distress Resp: Clear to auscultation bilaterally.  Normal effort Cardio: S1-S2 is normal regular.  No S3-S4.  No rubs murmurs or bruit GI: Abdomen is soft.  Nontender nondistended.  Bowel sounds are present normal.  No masses organomegaly Extremities: No edema.   Neurologic: Disoriented but no focal neurological deficits.      Lab Results:  Data Reviewed: I have personally reviewed following labs and imaging studies  CBC: Recent Labs  Lab 02/19/20 2202 02/20/20 0458 02/21/20 0536 02/22/20 0524 02/23/20 0548  WBC 15.5* 13.4* 10.3 9.8 10.1  NEUTROABS 11.9*  --   --   --   --   HGB 11.1* 10.8* 9.8* 9.9* 10.6*  HCT 33.4* 32.3* 28.9* 29.6* 32.6*  MCV 90.3 91.5 90.9 91.9 92.4  PLT 446* 386 334 346 361    Basic Metabolic Panel: Recent Labs  Lab 02/19/20 2202 02/20/20 0458 02/21/20 0536 02/22/20 0524  02/23/20 0548  NA 137 136 141 139 140  K 4.2 4.8 3.9 3.8 3.8  CL 101 103 106 105 104  CO2 23 21* 24 24 27   GLUCOSE 164* 145* 122* 159* 148*  BUN 26* 23 20 21 16   CREATININE 1.24 1.23 1.06 1.08 0.93  CALCIUM 9.2 8.8* 8.8* 9.0 9.2    GFR: Estimated Creatinine Clearance: 117.3 mL/min (by C-G formula based on SCr of 0.93 mg/dL).  Liver Function Tests: Recent Labs  Lab 02/19/20 2202  AST 22  ALT 11  ALKPHOS 104  BILITOT 0.7  PROT 8.2*  ALBUMIN 2.8*    Coagulation Profile: Recent Labs  Lab 02/20/20 0458  INR 1.1      Recent Results (from the past 240 hour(s))  Blood culture (single)     Status: None (Preliminary result)   Collection Time: 02/19/20 11:30 PM   Specimen: BLOOD  Result Value Ref Range Status   Specimen Description BLOOD LEFT ANTECUBITAL  Final   Special Requests   Final    BOTTLES DRAWN AEROBIC AND ANAEROBIC Blood Culture adequate volume   Culture   Final    NO GROWTH 4 DAYS Performed at [facility], [address] [place], [facility] [address]    Report Status PENDING  Incomplete  Urine culture     Status: Abnormal   Collection Time: 02/20/20  2:19 AM   Specimen: In/Out Cath Urine  Result Value Ref Range Status   Specimen Description   Final    IN/OUT CATH URINE Performed at [facility], [address] [place], [facility] [address]    Special Requests   Final    NONE Performed at [facility], [address] [place], [facility] [address]    Culture 60,000 COLONIES/mL PSEUDOMONAS AERUGINOSA (A)  Final   Report Status 02/23/2020 FINAL  Final   Organism ID, Bacteria PSEUDOMONAS AERUGINOSA (A)  Final      Susceptibility   Pseudomonas aeruginosa - MIC*    CEFTAZIDIME 4 SENSITIVE Sensitive     CIPROFLOXACIN <=0.25 SENSITIVE Sensitive     GENTAMICIN <=1 SENSITIVE Sensitive     IMIPENEM 1 SENSITIVE Sensitive     PIP/TAZO <=4 SENSITIVE Sensitive     CEFEPIME 2 SENSITIVE Sensitive     * 60,000 COLONIES/mL PSEUDOMONAS  AERUGINOSA  Respiratory Panel by RT PCR (Flu A&B, Covid) - Nasopharyngeal Swab     Status: None   Collection Time: 02/20/20  4:58 AM   Specimen: Nasopharyngeal Swab  Result Value Ref Range Status   SARS Coronavirus 2 by RT PCR NEGATIVE NEGATIVE Final    Comment: (NOTE) SARS-CoV-2 target nucleic acids are NOT DETECTED.  The SARS-CoV-2 RNA is generally detectable in upper respiratoy specimens during the acute phase of infection. The lowest concentration of SARS-CoV-2 viral copies this assay can detect is 131 copies/mL. A negative result does not preclude SARS-Cov-2 infection and should not be used as the sole basis for treatment or other patient management decisions. A negative result may occur with  improper specimen collection/handling, submission of specimen other than nasopharyngeal swab, presence of viral mutation(s) within the areas targeted by this assay, and inadequate number of viral copies (<131 copies/mL). A negative result must be combined with clinical observations, patient history, and epidemiological information. The expected result is Negative.  Fact Sheet for Patients:  https://www.moore.com/https://www.fda.gov/media/142436/download  Fact Sheet for Healthcare Providers:  https://www.young.biz/https://www.fda.gov/media/142435/download  This test is no t yet approved or cleared by the [place] FDA and  has been authorized for detection and/or diagnosis of SARS-CoV-2 by FDA under an Emergency Use Authorization (EUA). This EUA will remain  in effect (meaning this test can be used) for the duration of the COVID-19 declaration under Section 564(b)(1) of the Act, 21 U.S.C. section 360bbb-3(b)(1), unless the authorization is terminated or revoked sooner.     Influenza A by PCR NEGATIVE NEGATIVE Final   Influenza B by PCR NEGATIVE NEGATIVE Final    Comment: (NOTE) The Xpert Xpress SARS-CoV-2/FLU/RSV assay is intended as an aid in  the diagnosis of influenza from Nasopharyngeal swab specimens and  should not  be used as a sole basis for treatment. Nasal washings and  aspirates are unacceptable for Xpert Xpress SARS-CoV-2/FLU/RSV  testing.  Fact Sheet for Patients: https://www.moore.com/https://www.fda.gov/media/142436/download  Fact Sheet for Healthcare Providers: https://www.young.biz/https://www.fda.gov/media/142435/download  This test is not yet approved or cleared by the [place] FDA and  has been authorized for detection and/or diagnosis of SARS-CoV-2 by  FDA under an Emergency Use Authorization (EUA). This EUA will remain  in effect (meaning this test can be used) for the duration of the  Covid-19 declaration under Section 564(b)(1) of the Act, 21  U.S.C. section 360bbb-3(b)(1), unless the authorization is  terminated or revoked. Performed at [facility], [address] [place], [place] [id number]       Radiology Studies: DG Chest Port 1 View  Result Date: 02/21/2020 CLINICAL DATA:  CT chest 02/20/2020 EXAM: PORTABLE CHEST 1 VIEW COMPARISON:  CT chest 02/20/2020, chest radiograph 02/19/2020. FINDINGS: Similar mass in the left upper lobe, better characterized on recent CT chest. Low lung volumes without new consolidation. No visible pleural effusions or pneumothorax. Similar cardiomediastinal contour. Aortic atherosclerosis. No acute osseous abnormality. IMPRESSION: Similar mass in the left upper lobe, better characterized on recent CT chest. No new acute abnormality. Electronically Signed   By: Feliberto HartsFrederick S Jones MD   On: 02/21/2020 10:53  LOS: 3 days   Gizzelle Lacomb [employer] on [email or web address]  02/23/2020, 10:10 AM

## 2020-02-24 LAB — CULTURE, BLOOD (SINGLE)
Culture: NO GROWTH
Special Requests: ADEQUATE

## 2020-02-24 MED ORDER — LORAZEPAM 2 MG/ML IJ SOLN: 1.0000 mg | Freq: Once | INTRAMUSCULAR | Status: DC

## 2020-02-24 MED ORDER — QUETIAPINE FUMARATE 25 MG PO TABS
75.0000 mg | ORAL_TABLET | Freq: Two times a day (BID) | ORAL | Status: DC
  Administered 2020-02-24 – 2020-02-25 (×2): 75 mg via ORAL
  Filled 2020-02-24 (×2): qty 3

## 2020-02-24 MED ORDER — LORAZEPAM 0.5 MG PO TABS
0.5000 mg | ORAL_TABLET | Freq: Three times a day (TID) | ORAL | Status: DC | PRN
  Administered 2020-02-24: 0.5 mg via ORAL
  Filled 2020-02-24: qty 1

## 2020-02-24 MED ORDER — LORAZEPAM 2 MG/ML IJ SOLN
0.5000 mg | Freq: Once | INTRAMUSCULAR | Status: AC
  Administered 2020-02-24: 0.5 mg via INTRAVENOUS
  Filled 2020-02-24: qty 1

## 2020-02-24 MED ORDER — HALOPERIDOL LACTATE 5 MG/ML IJ SOLN: 2.0000 mg | Freq: Four times a day (QID) | INTRAMUSCULAR | Status: DC | PRN

## 2020-02-24 NOTE — Progress Notes (Signed)
Consult placed to start an IV. Pt is confused and has pulled out multiple IVs. Pt is also a very difficult stick. Pt only receiving IV haldol prn. Spoke with Colin Mulders, RN to request MD order to discontinue IV access and give haldol IM if needed.

## 2020-02-24 NOTE — Progress Notes (Addendum)
[facility] PROGRESS NOTE   Joseph CampiLonnie Potter ZOX:[id number] DOB: 01-Sep-1945 DOA: 02/19/2020  PCP: Pcp, No  Brief History/Interval Summary: 74 y.o. male with medical history significant for Hypertension and morbid obesity with history of frequent falls who was brought in by EMS who was initially called to the home for lift assist but on the evaluation patient was found to be hypotensive at 90/64.  Family reports that patient has been a bit confused for the past 24 hours.  He has had no fever or chills no cough or shortness of breath, no vomiting diarrhea and no complaints of abdominal pain or chest pain.  History limited due to mild confusion.  In the emergency department patient was noted to be normotensive.  He was noted to have leukocytosis.  Chest x-ray showed a large rounded opacity in the left lung.  Hospitalized for acute encephalopathy likely due to infection.  Reason for Visit: Acute metabolic encephalopathy.  Urinary tract infection  Consultants: None yet  Procedures: None yet  Antibiotics: Anti-infectives (From admission, onward)   Start     Dose/Rate Route Frequency Ordered Stop   02/23/20 1115  fosfomycin (MONUROL) packet 3 g        3 g Oral  Once 02/23/20 1024 02/23/20 1225   02/22/20 2200  cefUROXime (CEFTIN) tablet 500 mg  Status:  Discontinued        500 mg Oral Every 12 hours 02/22/20 1346 02/23/20 1024   02/22/20 0800  cefUROXime (CEFTIN) tablet 500 mg  Status:  Discontinued        500 mg Oral 2 times daily with meals 02/21/20 1502 02/22/20 1346   02/20/20 0800  cefTRIAXone (ROCEPHIN) 2 g in sodium chloride 0.9 % 100 mL IVPB        2 g 200 mL/hr over 30 Minutes Intravenous Every 24 hours 02/20/20 0121 02/21/20 1032   02/20/20 0200  azithromycin (ZITHROMAX) 500 mg in sodium chloride 0.9 % 250 mL IVPB        500 mg 250 mL/hr over 60 Minutes Intravenous Every 24 hours 02/20/20 0121 02/22/20 0206   02/19/20 2315  ceFEPIme (MAXIPIME) 2 g in sodium chloride 0.9 % 100  mL IVPB        2 g 200 mL/hr over 30 Minutes Intravenous  Once 02/19/20 2313 02/20/20 0007   02/19/20 2315  metroNIDAZOLE (FLAGYL) IVPB 500 mg        500 mg 100 mL/hr over 60 Minutes Intravenous  Once 02/19/20 2313 02/20/20 0055   02/19/20 2315  vancomycin (VANCOCIN) IVPB 1000 mg/200 mL premix        1,000 mg 200 mL/hr over 60 Minutes Intravenous  Once 02/19/20 2313 02/20/20 0131      Subjective/Interval History: Patient noted to be confused this morning.  Wants to get out of the bed.  Unable to answer questions appropriately.  Assessment/Plan:  Acute metabolic encephalopathy in the setting of known history of dementia with behavioral disturbances According to his wife patient has confusion issues at baseline.  Has been diagnosed with dementia.  This has been ongoing for 6 months.  He follows at the [facility].  Plan was for full home health services starting soon. It is possible that his acute worsening in mentation could could have been due to UTI.  No focal deficits were noted.  CT head did not show any acute findings.   Patient mentation has not improved much despite treatment with antibiotics.  So it is likely that this is all due  to progression of his dementia.  Patient was started on Seroquel for mood stabilization.  Dose was adjusted.  Patient continues to have episodes of agitation though appears to be less than before.  Continue Haldol as needed.  Continue current dose of Seroquel. Patient was seen by PT and OT.  Skilled nursing facility was recommended..  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  Palliative care was consulted.  He is likely eligible for hospice services as his life expectancy is thought to be 6 months or less.  I think for him the best case scenario would be to go back home with his wife with hospice services at home if family is able to provide support.  TOC and hospice  is following.  Appreciate palliative care assistance.  Urinary tract infection Noted to be afebrile with normal heart rate normal respiratory rate and normal saturations.  No evidence for sepsis.  Sepsis ruled out.  Patient was on ceftriaxone and was changed over to cefuroxime.  Urine culture with 60,000 colonies of Pseudomonas.  Sensitivities reviewed.  Cefuroxime was discontinued.  He was given 1 dose of fosfomycin.    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  Patient has a history of tobacco abuse.  Smoked more than 2 to 3 packs of cigarettes daily but has been quit for about 15 to 20 years.   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  Will need biopsy for definitive diagnosis.  This was discussed with the patient's wife.  Considering his progressive dementia he is not be a candidate for further testing and treatment.  She mentioned that the patient would not want any further testing based on previous conversations. Pursuing hospice at this time.  Essential hypertension Patient was hypotensive initially.  Blood pressure stabilized.  Occasional high readings could be due to agitation.  Noted to be on metoprolol.  Normocytic anemia Drop in hemoglobin is dilutional.  No evidence of overt bleeding.  Continue to monitor periodically.  Morbid obesity Estimated body mass index is 51.54 kg/m as calculated from the following:   Height as of this encounter: 6' (1.829 m).   Weight as of this encounter: 172.4 kg.   DVT Prophylaxis: Lovenox Code Status: DNR Family Communication: We will update wife later today Disposition Plan: Unclear.  Might be able to go home with hospice services.  Status is: Inpatient  Remains inpatient appropriate because:Altered mental status, IV treatments appropriate due to intensity of illness or inability to take PO and Inpatient level of care appropriate due to severity of illness   Dispo: The patient is from: Home               Anticipated d/c is to: Home versus SNF              Anticipated d/c date is: 2 days              Patient currently is not medically stable to d/c.     Medications:  Scheduled: . allopurinol  100 mg Oral BID  . amitriptyline  50 mg Oral QHS  . atorvastatin  20 mg Oral QHS  . donepezil  10 mg Oral QHS  . melatonin  5 mg Oral QHS  . metoprolol tartrate  25 mg Oral BID  . QUEtiapine  50 mg Oral BID   Continuous:  ZOX:[id number] **OR** acetaminophen, chlorpheniramine-HYDROcodone, haloperidol  lactate, ipratropium-albuterol, ondansetron **OR** ondansetron (ZOFRAN) IV, senna-docusate   Objective:  Vital Signs  Vitals:   02/23/20 2227 02/23/20 2230 02/24/20 0443 02/24/20 0917  BP: (!) 155/68  (!) 154/77 138/83  Pulse: (!) 57 (!) 54 78 65  Resp: 16  20 18   Temp: 98.6 F (37 C)  98.6 F (37 C) 98.2 F (36.8 C)  TempSrc:    Axillary  SpO2: 92%  92% 94%  Weight:      Height:       No intake or output data in the 24 hours ending 02/24/20 1003 Filed Weights   02/19/20 2158  Weight: (!) 172.4 kg    General appearance: Patient is awake alert.  Confused. Resp: Clear to auscultation bilaterally.  Normal effort Cardio: S1-S2 is normal regular.  No S3-S4.  No rubs murmurs or bruit GI: Abdomen is soft.  Nontender nondistended.  Bowel sounds are present normal.  No masses organomegaly Extremities: Noted to be moving all her extremities. Neurologic:   No focal neurological deficits.     Lab Results:  Data Reviewed: I have personally reviewed following labs and imaging studies  CBC: Recent Labs  Lab 02/19/20 2202 02/20/20 0458 02/21/20 0536 02/22/20 0524 02/23/20 0548  WBC 15.5* 13.4* 10.3 9.8 10.1  NEUTROABS 11.9*  --   --   --   --   HGB 11.1* 10.8* 9.8* 9.9* 10.6*  HCT 33.4* 32.3* 28.9* 29.6* 32.6*  MCV 90.3 91.5 90.9 91.9 92.4  PLT 446* 386 334 346 361    Basic Metabolic Panel: Recent Labs  Lab 02/19/20 2202 02/20/20 0458 02/21/20 0536 02/22/20 0524  02/23/20 0548  NA 137 136 141 139 140  K 4.2 4.8 3.9 3.8 3.8  CL 101 103 106 105 104  CO2 23 21* 24 24 27   GLUCOSE 164* 145* 122* 159* 148*  BUN 26* 23 20 21 16   CREATININE 1.24 1.23 1.06 1.08 0.93  CALCIUM 9.2 8.8* 8.8* 9.0 9.2    GFR: Estimated Creatinine Clearance: 117.3 mL/min (by C-G formula based on SCr of 0.93 mg/dL).  Liver Function Tests: Recent Labs  Lab 02/19/20 2202  AST 22  ALT 11  ALKPHOS 104  BILITOT 0.7  PROT 8.2*  ALBUMIN 2.8*    Coagulation Profile: Recent Labs  Lab 02/20/20 0458  INR 1.1      Recent Results (from the past 240 hour(s))  Blood culture (single)     Status: None   Collection Time: 02/19/20 11:30 PM   Specimen: BLOOD  Result Value Ref Range Status   Specimen Description BLOOD LEFT ANTECUBITAL  Final   Special Requests   Final    BOTTLES DRAWN AEROBIC AND ANAEROBIC Blood Culture adequate volume   Culture   Final    NO GROWTH 5 DAYS Performed at [facility], [address] [place], [facility] [address]    Report Status 02/24/2020 FINAL  Final  Urine culture     Status: Abnormal   Collection Time: 02/20/20  2:19 AM   Specimen: In/Out Cath Urine  Result Value Ref Range Status   Specimen Description   Final    IN/OUT CATH URINE Performed at [facility], [address] [place], [facility] [address]    Special Requests   Final    NONE Performed at [facility], [address] [place], [facility] [address]    Culture 60,000 COLONIES/mL PSEUDOMONAS AERUGINOSA (A)  Final   Report Status 02/23/2020 FINAL  Final   Organism ID,  Bacteria PSEUDOMONAS AERUGINOSA (A)  Final      Susceptibility   Pseudomonas aeruginosa - MIC*    CEFTAZIDIME 4 SENSITIVE Sensitive     CIPROFLOXACIN <=0.25 SENSITIVE Sensitive     GENTAMICIN <=1 SENSITIVE Sensitive     IMIPENEM 1 SENSITIVE Sensitive     PIP/TAZO <=4 SENSITIVE Sensitive     CEFEPIME 2 SENSITIVE Sensitive     * 60,000 COLONIES/mL PSEUDOMONAS AERUGINOSA    Respiratory Panel by RT PCR (Flu A&B, Covid) - Nasopharyngeal Swab     Status: None   Collection Time: 02/20/20  4:58 AM   Specimen: Nasopharyngeal Swab  Result Value Ref Range Status   SARS Coronavirus 2 by RT PCR NEGATIVE NEGATIVE Final    Comment: (NOTE) SARS-CoV-2 target nucleic acids are NOT DETECTED.  The SARS-CoV-2 RNA is generally detectable in upper respiratoy specimens during the acute phase of infection. The lowest concentration of SARS-CoV-2 viral copies this assay can detect is 131 copies/mL. A negative result does not preclude SARS-Cov-2 infection and should not be used as the sole basis for treatment or other patient management decisions. A negative result may occur with  improper specimen collection/handling, submission of specimen other than nasopharyngeal swab, presence of viral mutation(s) within the areas targeted by this assay, and inadequate number of viral copies (<131 copies/mL). A negative result must be combined with clinical observations, patient history, and epidemiological information. The expected result is Negative.  Fact Sheet for Patients:  https://www.moore.com/  Fact Sheet for Healthcare Providers:  https://www.young.biz/  This test is no t yet approved or cleared by the [place] FDA and  has been authorized for detection and/or diagnosis of SARS-CoV-2 by FDA under an Emergency Use Authorization (EUA). This EUA will remain  in effect (meaning this test can be used) for the duration of the COVID-19 declaration under Section 564(b)(1) of the Act, 21 U.S.C. section 360bbb-3(b)(1), unless the authorization is terminated or revoked sooner.     Influenza A by PCR NEGATIVE NEGATIVE Final   Influenza B by PCR NEGATIVE NEGATIVE Final    Comment: (NOTE) The Xpert Xpress SARS-CoV-2/FLU/RSV assay is intended as an aid in  the diagnosis of influenza from Nasopharyngeal swab specimens and  should not be used as  a sole basis for treatment. Nasal washings and  aspirates are unacceptable for Xpert Xpress SARS-CoV-2/FLU/RSV  testing.  Fact Sheet for Patients: https://www.moore.com/  Fact Sheet for Healthcare Providers: https://www.young.biz/  This test is not yet approved or cleared by the [place] FDA and  has been authorized for detection and/or diagnosis of SARS-CoV-2 by  FDA under an Emergency Use Authorization (EUA). This EUA will remain  in effect (meaning this test can be used) for the duration of the  Covid-19 declaration under Section 564(b)(1) of the Act, 21  U.S.C. section 360bbb-3(b)(1), unless the authorization is  terminated or revoked. Performed at [facility], [address] [place], [place] [address]       Radiology Studies: No results found.     LOS: 4 days   Taiylor Virden [employer] on [email or web address]  02/24/2020, 10:03 AM

## 2020-02-24 NOTE — Progress Notes (Signed)
Pt verbalized suicide thought if I leave the room. Pt was agitated, IV Haldol PRN was given. Pt has safety rounder, room is near the nurses station. Wife was called to calm him down. MD was notified, said it is caused by dementia. Currently, pt is sleep, wife at bedside.

## 2020-02-24 NOTE — Progress Notes (Signed)
Per NP Webb Silversmith, it is ok for pt to not have an IV access.

## 2020-02-25 MED ORDER — QUETIAPINE FUMARATE 100 MG PO TABS: 100.0000 mg | ORAL_TABLET | Freq: Two times a day (BID) | ORAL | Status: DC

## 2020-02-25 MED ORDER — QUETIAPINE FUMARATE 25 MG PO TABS
75.0000 mg | ORAL_TABLET | Freq: Two times a day (BID) | ORAL | Status: DC
  Administered 2020-02-25 – 2020-02-28 (×6): 75 mg via ORAL
  Filled 2020-02-25 (×6): qty 3

## 2020-02-25 NOTE — Progress Notes (Signed)
[facility] PROGRESS NOTE   Joseph Potter EYC:[id number] DOB: 1945/09/03 DOA: 02/19/2020  PCP: Pcp, No  Brief History/Interval Summary: 74 y.o. male with medical history significant for Hypertension and morbid obesity with history of frequent falls who was brought in by EMS who was initially called to the home for lift assist but on the evaluation patient was found to be hypotensive at 90/64.  Family reports that patient has been a bit confused for the past 24 hours.  He has had no fever or chills no cough or shortness of breath, no vomiting diarrhea and no complaints of abdominal pain or chest pain.  History limited due to mild confusion.  In the emergency department patient was noted to be normotensive.  He was noted to have leukocytosis.  Chest x-ray showed a large rounded opacity in the left lung.  Hospitalized for acute encephalopathy likely due to infection.  Reason for Visit: Acute metabolic encephalopathy.  Urinary tract infection  Consultants: None yet  Procedures: None yet  Antibiotics: Anti-infectives (From admission, onward)   Start     Dose/Rate Route Frequency Ordered Stop   02/23/20 1115  fosfomycin (MONUROL) packet 3 g        3 g Oral  Once 02/23/20 1024 02/23/20 1225   02/22/20 2200  cefUROXime (CEFTIN) tablet 500 mg  Status:  Discontinued        500 mg Oral Every 12 hours 02/22/20 1346 02/23/20 1024   02/22/20 0800  cefUROXime (CEFTIN) tablet 500 mg  Status:  Discontinued        500 mg Oral 2 times daily with meals 02/21/20 1502 02/22/20 1346   02/20/20 0800  cefTRIAXone (ROCEPHIN) 2 g in sodium chloride 0.9 % 100 mL IVPB        2 g 200 mL/hr over 30 Minutes Intravenous Every 24 hours 02/20/20 0121 02/21/20 1032   02/20/20 0200  azithromycin (ZITHROMAX) 500 mg in sodium chloride 0.9 % 250 mL IVPB        500 mg 250 mL/hr over 60 Minutes Intravenous Every 24 hours 02/20/20 0121 02/22/20 0206   02/19/20 2315  ceFEPIme (MAXIPIME) 2 g in sodium chloride 0.9 % 100  mL IVPB        2 g 200 mL/hr over 30 Minutes Intravenous  Once 02/19/20 2313 02/20/20 0007   02/19/20 2315  metroNIDAZOLE (FLAGYL) IVPB 500 mg        500 mg 100 mL/hr over 60 Minutes Intravenous  Once 02/19/20 2313 02/20/20 0055   02/19/20 2315  vancomycin (VANCOCIN) IVPB 1000 mg/200 mL premix        1,000 mg 200 mL/hr over 60 Minutes Intravenous  Once 02/19/20 2313 02/20/20 0131      Subjective/Interval History: Patient noted to have been confused overnight with.  Some agitation requiring sedative medications.  Noted to be confused this morning as he was yesterday.  No changes noted otherwise.  Denies any complaints.  Assessment/Plan:  Acute metabolic encephalopathy in the setting of known history of dementia with behavioral disturbances According to his wife patient has confusion issues at baseline.  Has been diagnosed with dementia.  This has been ongoing for 6 months.  He follows at the [place].  Plan was for full home health services starting soon. It is possible that his acute worsening in mentation could could have been due to UTI.  No focal deficits were noted.  CT head did not show any acute findings.   Patient mentation has not improved much despite  treatment with antibiotics.  So it is likely that this is all due to progression of his dementia.  Patient was started on Seroquel for mood stabilization.  Dose was adjusted.  Patient continues to have episodes of agitation though appears to be less than before.  Continue Haldol as needed.  Continue current dose of Seroquel. Patient was seen by PT and OT.  Skilled nursing facility was recommended..  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  Palliative care was consulted.  He is likely eligible for hospice services as his life expectancy is thought to be 6 months or less.  I think for him the best case scenario would be to go back home with  his wife with hospice services at home if family is able to provide support.   TOC and hospice is following.  Appreciate palliative care assistance. Waiting on final disposition plans.  Urinary tract infection Noted to be afebrile with normal heart rate normal respiratory rate and normal saturations.  No evidence for sepsis.  Sepsis ruled out.  Patient was on ceftriaxone and was changed over to cefuroxime.  Urine culture with 60,000 colonies of Pseudomonas.  Sensitivities reviewed.  Cefuroxime was discontinued.   He was given 1 dose of fosfomycin.    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  Patient has a history of tobacco abuse.  Smoked more than 2 to 3 packs of cigarettes daily but has been quit for about 15 to 20 years.   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  Will need biopsy for definitive diagnosis.  This was discussed with the patient's wife.  Considering his progressive dementia he is not be a candidate for further testing and treatment.  She mentioned that the patient would not want any further testing based on previous conversations. Pursuing hospice at this time.  Essential hypertension Patient was hypotensive initially.  Blood pressure stabilized.  Occasional high readings could be due to agitation.  Noted to be on metoprolol.  Continue current management.  Normocytic anemia Drop in hemoglobin is dilutional.  No evidence of overt bleeding.  Continue to monitor periodically.  Morbid obesity Estimated body mass index is 51.54 kg/m as calculated from the following:   Height as of this encounter: 6' (1.829 m).   Weight as of this encounter: 172.4 kg.   DVT Prophylaxis: Lovenox Code Status: DNR Family Communication: No family at bedside Disposition Plan: Unclear.  Might be able to go home with hospice services.  Status is: Inpatient  Remains inpatient appropriate because:Altered mental status, IV treatments appropriate due to intensity of  illness or inability to take PO and Inpatient level of care appropriate due to severity of illness   Dispo: The patient is from: Home              Anticipated d/c is to: Home with hospice versus SNF              Anticipated d/c date is: 2 days              Patient currently is not medically stable to d/c.     Medications:  Scheduled: . allopurinol  100 mg Oral BID  . amitriptyline  50 mg Oral QHS  . atorvastatin  20 mg Oral QHS  . donepezil  10 mg Oral QHS  . LORazepam  1 mg Intravenous Once  .  melatonin  5 mg Oral QHS  . metoprolol tartrate  25 mg Oral BID  . QUEtiapine  75 mg Oral BID   Continuous:  VOZ:[id number] **OR** acetaminophen, chlorpheniramine-HYDROcodone, haloperidol lactate, ipratropium-albuterol, LORazepam, ondansetron **OR** ondansetron (ZOFRAN) IV, senna-docusate   Objective:  Vital Signs  Vitals:   02/24/20 2339 02/25/20 0314 02/25/20 0759 02/25/20 1134  BP: (!) 146/64 (!) 158/76 (!) 155/78 118/70  Pulse: 62 66 62 (!) 57  Resp: 20 20 18 16   Temp: 98.1 F (36.7 C) 97.7 F (36.5 C) 98.7 F (37.1 C) 98.1 F (36.7 C)  TempSrc:  Oral Oral Oral  SpO2: 95% 91% 96% 93%  Weight:      Height:        Intake/Output Summary (Last 24 hours) at 02/25/2020 1217 Last data filed at 02/24/2020 1900 Gross per 24 hour  Intake 0 ml  Output --  Net 0 ml   Filed Weights   02/19/20 2158  Weight: (!) 172.4 kg    General appearance: Remains confused and distracted.  In no distress Resp: Clear to auscultation bilaterally.  Normal effort Cardio: S1-S2 is normal regular.  No S3-S4.  No rubs murmurs or bruit GI: Abdomen is soft.  Nontender nondistended.  Bowel sounds are present normal.  No masses organomegaly Extremities: To be moving all his extremities Neurologic: Disoriented.  No obvious focal neurological deficits.    Lab Results:  Data Reviewed: I have personally reviewed following labs and imaging studies  CBC: Recent Labs  Lab 02/19/20 2202  02/20/20 0458 02/21/20 0536 02/22/20 0524 02/23/20 0548  WBC 15.5* 13.4* 10.3 9.8 10.1  NEUTROABS 11.9*  --   --   --   --   HGB 11.1* 10.8* 9.8* 9.9* 10.6*  HCT 33.4* 32.3* 28.9* 29.6* 32.6*  MCV 90.3 91.5 90.9 91.9 92.4  PLT 446* 386 334 346 361    Basic Metabolic Panel: Recent Labs  Lab 02/19/20 2202 02/20/20 0458 02/21/20 0536 02/22/20 0524 02/23/20 0548  NA 137 136 141 139 140  K 4.2 4.8 3.9 3.8 3.8  CL 101 103 106 105 104  CO2 23 21* 24 24 27   GLUCOSE 164* 145* 122* 159* 148*  BUN 26* 23 20 21 16   CREATININE 1.24 1.23 1.06 1.08 0.93  CALCIUM 9.2 8.8* 8.8* 9.0 9.2    GFR: Estimated Creatinine Clearance: 117.3 mL/min (by C-G formula based on SCr of 0.93 mg/dL).  Liver Function Tests: Recent Labs  Lab 02/19/20 2202  AST 22  ALT 11  ALKPHOS 104  BILITOT 0.7  PROT 8.2*  ALBUMIN 2.8*    Coagulation Profile: Recent Labs  Lab 02/20/20 0458  INR 1.1      Recent Results (from the past 240 hour(s))  Blood culture (single)     Status: None   Collection Time: 02/19/20 11:30 PM   Specimen: BLOOD  Result Value Ref Range Status   Specimen Description BLOOD LEFT ANTECUBITAL  Final   Special Requests   Final    BOTTLES DRAWN AEROBIC AND ANAEROBIC Blood Culture adequate volume   Culture   Final    NO GROWTH 5 DAYS Performed at [facility], [address] [place], [facility] [address]    Report Status 02/24/2020 FINAL  Final  Urine culture     Status: Abnormal   Collection Time: 02/20/20  2:19 AM   Specimen: In/Out Cath Urine  Result Value Ref Range Status   Specimen Description   Final    IN/OUT CATH URINE Performed at  [facility], [address] [place], [place] [address]    Special Requests   Final    NONE Performed at [facility], [address] [place], [place] [address]    Culture 60,000 COLONIES/mL PSEUDOMONAS AERUGINOSA (A)  Final   Report Status 02/23/2020 FINAL  Final   Organism ID, Bacteria PSEUDOMONAS  AERUGINOSA (A)  Final      Susceptibility   Pseudomonas aeruginosa - MIC*    CEFTAZIDIME 4 SENSITIVE Sensitive     CIPROFLOXACIN <=0.25 SENSITIVE Sensitive     GENTAMICIN <=1 SENSITIVE Sensitive     IMIPENEM 1 SENSITIVE Sensitive     PIP/TAZO <=4 SENSITIVE Sensitive     CEFEPIME 2 SENSITIVE Sensitive     * 60,000 COLONIES/mL PSEUDOMONAS AERUGINOSA  Respiratory Panel by RT PCR (Flu A&B, Covid) - Nasopharyngeal Swab     Status: None   Collection Time: 02/20/20  4:58 AM   Specimen: Nasopharyngeal Swab  Result Value Ref Range Status   SARS Coronavirus 2 by RT PCR NEGATIVE NEGATIVE Final    Comment: (NOTE) SARS-CoV-2 target nucleic acids are NOT DETECTED.  The SARS-CoV-2 RNA is generally detectable in upper respiratoy specimens during the acute phase of infection. The lowest concentration of SARS-CoV-2 viral copies this assay can detect is 131 copies/mL. A negative result does not preclude SARS-Cov-2 infection and should not be used as the sole basis for treatment or other patient management decisions. A negative result may occur with  improper specimen collection/handling, submission of specimen other than nasopharyngeal swab, presence of viral mutation(s) within the areas targeted by this assay, and inadequate number of viral copies (<131 copies/mL). A negative result must be combined with clinical observations, patient history, and epidemiological information. The expected result is Negative.  Fact Sheet for Patients:  https://www.moore.com/https://www.fda.gov/media/142436/download  Fact Sheet for Healthcare Providers:  https://www.young.biz/https://www.fda.gov/media/142435/download  This test is no t yet approved or cleared by the [place] FDA and  has been authorized for detection and/or diagnosis of SARS-CoV-2 by FDA under an Emergency Use Authorization (EUA). This EUA will remain  in effect (meaning this test can be used) for the duration of the COVID-19 declaration under Section 564(b)(1) of the Act, 21  U.S.C. section 360bbb-3(b)(1), unless the authorization is terminated or revoked sooner.     Influenza A by PCR NEGATIVE NEGATIVE Final   Influenza B by PCR NEGATIVE NEGATIVE Final    Comment: (NOTE) The Xpert Xpress SARS-CoV-2/FLU/RSV assay is intended as an aid in  the diagnosis of influenza from Nasopharyngeal swab specimens and  should not be used as a sole basis for treatment. Nasal washings and  aspirates are unacceptable for Xpert Xpress SARS-CoV-2/FLU/RSV  testing.  Fact Sheet for Patients: https://www.moore.com/https://www.fda.gov/media/142436/download  Fact Sheet for Healthcare Providers: https://www.young.biz/https://www.fda.gov/media/142435/download  This test is not yet approved or cleared by the [place] FDA and  has been authorized for detection and/or diagnosis of SARS-CoV-2 by  FDA under an Emergency Use Authorization (EUA). This EUA will remain  in effect (meaning this test can be used) for the duration of the  Covid-19 declaration under Section 564(b)(1) of the Act, 21  U.S.C. section 360bbb-3(b)(1), unless the authorization is  terminated or revoked. Performed at [facility], [address] [place], [place] [address]       Radiology Studies: No results found.     LOS: 5 days   Mendell Bontempo Foot LockerKrishnan  [facility] Pager on [email or web address]  02/25/2020, 12:17 PM

## 2020-02-25 NOTE — Progress Notes (Signed)
[facility] hospital liaison note:  Follow up to new referral for [facility] hospice services at home. Writer spoke via telephone to patient's wife [place]. She plans to be at [facility] this afternoon and Clinical research associate will meet with her then. TOC [address] updated. Dayna Barker BSN, RN, [facility] liaison [facility]  [phone]

## 2020-02-25 NOTE — Progress Notes (Signed)
[facility] Liaison:  Writer met in the room with Joseph Potter to initiate education regarding hospice services, philosophy and team approach to care with understanding voiced. DME needs discussed.  Hospital bed and nebulizer machine have been ordered.  Joseph Potter does need to move furniture and will get family assistance, hopefully to be able to receive DME later tomorrow. TOC [place] updated. Flo Shanks BSN, RN, [place] [phone]

## 2020-02-25 NOTE — Care Management Important Message (Signed)
Important Message  Patient Details  Name: Joseph Potter MRN: [id number] Date of Birth: [birth year]   Medicare Important Message Given:  Yes     [name] 02/25/2020, 12:12 PM

## 2020-02-25 NOTE — Progress Notes (Signed)
Occupational Therapy Treatment Patient Details Name: Lilburn Straw MRN: [id number] DOB: [birth year] Today's Date: 02/25/2020    History of present illness 74y.o. male with PMHx of HTN, obesity, DM, frequent falls who was brought in by EMS who was initially called to the home for lift assist but on the evaluation patient was found to be hypotensive at 90/64.  Wife reports pt has been confused for the past 24 hours. Negative for fever, chills, cough, vomiting, diarrhea, and abdominal pain or chest pain. In ED, pt was noted to have leukocytosis. Chest x-ray showed a large rounded opacity in the left lung. Hospitalized for acute encephalopathy.   OT comments  Pt highly confused today, having visual hallucinations and making frequent nonsensical comments. Pt unable to report pain status. Despite repeated attempts with +2 assistance, pt unable to come to EOB sitting, minimally able to participate in rolling in bed. Pt had wheezing cough throughout session, nurse notified. Wife requests guidance re: DC, wondering about advantages/disadvantages of palliative care. Last week, wife had stated she would not consider a nursing home -- that she was determined to take pt home. During today's session, therapist stated that she felt SNF might be best discharge option, unless family is able to arrange for 24-home care. Wife stated that she is now open to considering that option.   Follow Up Recommendations  SNF    Equipment Recommendations       Recommendations for Other Services      Precautions / Restrictions Precautions Precautions: Fall Restrictions Weight Bearing Restrictions: No       Mobility Bed Mobility Overal bed mobility: Needs Assistance Bed Mobility: Rolling Rolling: Max assist;+2 for physical assistance   Supine to sit: Total assist     General bed mobility comments: Pt unable to come to EOB sitting with + 2 Max A  Transfers                      Balance Overall balance  assessment: Needs assistance   Sitting balance-Leahy Scale: Zero                                     ADL either performed or assessed with clinical judgement   ADL Overall ADL's : Needs assistance/impaired                                     Functional mobility during ADLs: Maximal assistance       Vision Patient Visual Report: No change from baseline     Perception     Praxis      Cognition     Overall Cognitive Status: Impaired/Different from baseline Area of Impairment: Orientation;Attention;Safety/judgement;Following commands;Problem solving                 Orientation Level: Disoriented to;Place;Time;Situation             General Comments: Pt unable to engage during session; having visual hallucinations and non-sensical speech        Exercises Other Exercises Other Exercises: Educ family re: POC, DC   Shoulder Instructions       General Comments Pt highly confused, lethargic    Pertinent Vitals/ Pain       Pain Assessment: No/denies pain  Home Living  Prior Functioning/Environment              Frequency  Min 1X/week        Progress Toward Goals  OT Goals(current goals can now be found in the care plan section)     Acute Rehab OT Goals Patient Stated Goal: none stated OT Goal Formulation: With patient/family Time For Goal Achievement: 03/05/20 Potential to Achieve Goals: Fair  Plan Discharge plan needs to be updated    Co-evaluation                 AM-PAC OT "6 Clicks" Daily Activity     Outcome Measure   Help from another person eating meals?: A Little Help from another person taking care of personal grooming?: A Lot Help from another person toileting, which includes using toliet, bedpan, or urinal?: A Lot Help from another person bathing (including washing, rinsing, drying)?: A Lot Help from another person to put on and  taking off regular upper body clothing?: A Lot Help from another person to put on and taking off regular lower body clothing?: A Lot 6 Click Score: 13    End of Session    OT Visit Diagnosis: Unsteadiness on feet (R26.81);Repeated falls (R29.6);History of falling (Z91.81);Muscle weakness (generalized) (M62.81);Other symptoms and signs involving cognitive function   Activity Tolerance Patient limited by lethargy   Patient Left in bed;with call bell/phone within reach;with family/visitor present   Nurse Communication          Time: 5188-4166 OT Time Calculation (min): 25 min  Charges: OT General Charges $OT Visit: 1 Visit OT Treatments $Self Care/Home Management : 23-37 mins  Latina Craver, PhD, MS, OTR/L ascom [phone] 02/25/20, 2:27 PM

## 2020-02-26 ENCOUNTER — Inpatient Hospital Stay: Payer: No Typology Code available for payment source

## 2020-02-26 DIAGNOSIS — R062 Wheezing: Secondary | ICD-10-CM

## 2020-02-26 LAB — CBC
HCT: 32.9 % — ABNORMAL LOW (ref 39.0–52.0)
Hemoglobin: 10.7 g/dL — ABNORMAL LOW (ref 13.0–17.0)
MCH: 30.1 pg (ref 26.0–34.0)
MCHC: 32.5 g/dL (ref 30.0–36.0)
MCV: 92.7 fL (ref 80.0–100.0)
Platelets: 425 10*3/uL — ABNORMAL HIGH (ref 150–400)
RBC: 3.55 MIL/uL — ABNORMAL LOW (ref 4.22–5.81)
RDW: 13.7 % (ref 11.5–15.5)
WBC: 17.1 10*3/uL — ABNORMAL HIGH (ref 4.0–10.5)
nRBC: 0 % (ref 0.0–0.2)

## 2020-02-26 LAB — BASIC METABOLIC PANEL
Anion gap: 11 (ref 5–15)
BUN: 17 mg/dL (ref 8–23)
CO2: 25 mmol/L (ref 22–32)
Calcium: 9.1 mg/dL (ref 8.9–10.3)
Chloride: 102 mmol/L (ref 98–111)
Creatinine, Ser: 1.16 mg/dL (ref 0.61–1.24)
GFR, Estimated: >60 mL/min (ref >60)
Glucose, Bld: 155 mg/dL — ABNORMAL HIGH (ref 70–99)
Potassium: 4.4 mmol/L (ref 3.5–5.1)
Sodium: 138 mmol/L (ref 135–145)

## 2020-02-26 MED ORDER — AMOXICILLIN-POT CLAVULANATE 875-125 MG PO TABS
1.0000 | ORAL_TABLET | Freq: Two times a day (BID) | ORAL | Status: DC
  Administered 2020-02-26 – 2020-02-28 (×5): 1 via ORAL
  Filled 2020-02-26 (×5): qty 1

## 2020-02-26 NOTE — Progress Notes (Addendum)
[facility] PROGRESS NOTE   Joseph Potter JKD:[id number] DOB: 20-Apr-1946 DOA: 02/19/2020  PCP: Pcp, No  Brief History/Interval Summary: 74 y.o. male with medical history significant for Hypertension and morbid obesity with history of frequent falls who was brought in by EMS who was initially called to the home for lift assist but on the evaluation patient was found to be hypotensive at 90/64.  Family reports that patient has been a bit confused for the past 24 hours.  He has had no fever or chills no cough or shortness of breath, no vomiting diarrhea and no complaints of abdominal pain or chest pain.  History limited due to mild confusion.  In the emergency department patient was noted to be normotensive.  He was noted to have leukocytosis.  Chest x-ray showed a large rounded opacity in the left lung.  Hospitalized for acute encephalopathy likely due to infection.  Reason for Visit: Acute metabolic encephalopathy.  Urinary tract infection  Consultants: None yet  Procedures: None yet  Antibiotics: Anti-infectives (From admission, onward)   Start     Dose/Rate Route Frequency Ordered Stop   02/23/20 1115  fosfomycin (MONUROL) packet 3 g        3 g Oral  Once 02/23/20 1024 02/23/20 1225   02/22/20 2200  cefUROXime (CEFTIN) tablet 500 mg  Status:  Discontinued        500 mg Oral Every 12 hours 02/22/20 1346 02/23/20 1024   02/22/20 0800  cefUROXime (CEFTIN) tablet 500 mg  Status:  Discontinued        500 mg Oral 2 times daily with meals 02/21/20 1502 02/22/20 1346   02/20/20 0800  cefTRIAXone (ROCEPHIN) 2 g in sodium chloride 0.9 % 100 mL IVPB        2 g 200 mL/hr over 30 Minutes Intravenous Every 24 hours 02/20/20 0121 02/21/20 1032   02/20/20 0200  azithromycin (ZITHROMAX) 500 mg in sodium chloride 0.9 % 250 mL IVPB        500 mg 250 mL/hr over 60 Minutes Intravenous Every 24 hours 02/20/20 0121 02/22/20 0206   02/19/20 2315  ceFEPIme (MAXIPIME) 2 g in sodium chloride 0.9 % 100  mL IVPB        2 g 200 mL/hr over 30 Minutes Intravenous  Once 02/19/20 2313 02/20/20 0007   02/19/20 2315  metroNIDAZOLE (FLAGYL) IVPB 500 mg        500 mg 100 mL/hr over 60 Minutes Intravenous  Once 02/19/20 2313 02/20/20 0055   02/19/20 2315  vancomycin (VANCOCIN) IVPB 1000 mg/200 mL premix        1,000 mg 200 mL/hr over 60 Minutes Intravenous  Once 02/19/20 2313 02/20/20 0131      Subjective/Interval History: Patient noted to be a little bit more lethargic today.  Noted to have low-grade fever this morning.  Noted to be elevated more short of breath this morning.    Assessment/Plan:  Acute metabolic encephalopathy in the setting of known history of dementia with behavioral disturbances According to his wife patient has confusion issues at baseline.  Has been diagnosed with dementia.  This has been ongoing for 6 months.  He follows at the [place].  Plan was for full home health services starting soon. It is possible that his acute worsening in mentation could could have been due to UTI.  No focal deficits were noted.  CT head did not show any acute findings.   Patient mentation has not improved much despite treatment with antibiotics.  So it is likely that this is all due to progression of his dementia.  Patient was started on Seroquel for mood stabilization.  Dose was adjusted.  Patient continues to have episodes of agitation though appears to be less than before.  Continue Haldol as needed.  Continue current dose of Seroquel. Patient was seen by PT and OT.  Skilled nursing facility was recommended..  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  Palliative care was consulted.  He is likely eligible for hospice services as his life expectancy is thought to be 6 months or less.  I think for him the best case scenario would be to go back home with his wife with hospice services at home if family is  able to provide support.   TOC and hospice is following.  Appreciate palliative care assistance. It appears that plan is for him to go home once DME has been arranged.  Tachypnea/low-grade fever Patient noted to have high respiratory rate today.  He has rhonchorous breath sounds.  Noted to have low-grade fever.  Quite possible he has aspirated.  Will do CBC and a chest x-ray.  Chest x-ray continues to show the left-sided mass.  Persistent right basilar atelectasis noted.  WBC noted to be high at 17.1.  Concern for aspiration.  Unasyn was initially ordered.  However patient does not have IV access and has been refusing/taking it out.  So we will place him on Augmentin for now.  Urinary tract infection Noted to be afebrile with normal heart rate normal respiratory rate and normal saturations.  No evidence for sepsis.  Sepsis ruled out.  Patient was on ceftriaxone and was changed over to cefuroxime.  Urine culture with 60,000 colonies of Pseudomonas.  Sensitivities reviewed.  Cefuroxime was discontinued.   He was given 1 dose of fosfomycin.    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  Patient has a history of tobacco abuse.  Smoked more than 2 to 3 packs of cigarettes daily but has been quit for about 15 to 20 years.   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  Will need biopsy for definitive diagnosis.  This was discussed with the patient's wife.  Considering his progressive dementia he is not be a candidate for further testing and treatment.  She mentioned that the patient would not want any further testing based on previous conversations. Hospice is following  Essential hypertension Patient was hypotensive initially.  Blood pressure stabilized.  Occasional high readings could be due to agitation.  Noted to be on metoprolol.  Continue current management.  Normocytic anemia Drop in hemoglobin is dilutional.  No overt bleeding was noted.  Morbid obesity Estimated  body mass index is 51.54 kg/m as calculated from the following:   Height as of this encounter: 6' (1.829 m).   Weight as of this encounter: 172.4 kg.   DVT Prophylaxis: Lovenox Code Status: DNR Family Communication: No family at bedside.  Hospice has been communicating with family members. Disposition Plan: Plan is for him to go home with hospice.  Status is: Inpatient  Remains inpatient appropriate because:Altered mental status, IV treatments appropriate due to intensity of illness or inability to take PO and Inpatient level of care appropriate due to severity of illness   Dispo: The patient is from: Home  Anticipated d/c is to: Home with hospice versus SNF              Anticipated d/c date is: 2 days              Patient currently is not medically stable to d/c.     Medications:  Scheduled: . allopurinol  100 mg Oral BID  . amitriptyline  50 mg Oral QHS  . atorvastatin  20 mg Oral QHS  . donepezil  10 mg Oral QHS  . LORazepam  1 mg Intravenous Once  . melatonin  5 mg Oral QHS  . metoprolol tartrate  25 mg Oral BID  . QUEtiapine  75 mg Oral BID   Continuous:  IWP:[id number] **OR** acetaminophen, chlorpheniramine-HYDROcodone, haloperidol lactate, ipratropium-albuterol, LORazepam, ondansetron **OR** ondansetron (ZOFRAN) IV, senna-docusate   Objective:  Vital Signs  Vitals:   02/26/20 0015 02/26/20 0351 02/26/20 0708 02/26/20 0729  BP: (!) 148/67 139/76 (!) 143/83   Pulse: 73 80 70   Resp: 17 20 18    Temp: 99.6 F (37.6 C) 98.2 F (36.8 C) 100 F (37.8 C) 99.8 F (37.7 C)  TempSrc: Oral  Oral Oral  SpO2: 93% 91% 92%   Weight:      Height:       No intake or output data in the 24 hours ending 02/26/20 1022 Filed Weights   02/19/20 2158  Weight: (!) 172.4 kg    General appearance: Noted to be lethargic but easily arousable.  Confused. Resp: Noted to be tachypneic.  Rhonchorous breath sounds bilaterally. Cardio: S1-S2 is normal regular.  No  S3-S4.  No rubs murmurs or bruit GI: Abdomen is soft.  Nontender nondistended.  Bowel sounds are present normal.  No masses organomegaly Extremities: No edema.   Neurologic: Remains disoriented.  No obvious focal deficits.   Lab Results:  Data Reviewed: I have personally reviewed following labs and imaging studies  CBC: Recent Labs  Lab 02/19/20 2202 02/20/20 0458 02/21/20 0536 02/22/20 0524 02/23/20 0548  WBC 15.5* 13.4* 10.3 9.8 10.1  NEUTROABS 11.9*  --   --   --   --   HGB 11.1* 10.8* 9.8* 9.9* 10.6*  HCT 33.4* 32.3* 28.9* 29.6* 32.6*  MCV 90.3 91.5 90.9 91.9 92.4  PLT 446* 386 334 346 361    Basic Metabolic Panel: Recent Labs  Lab 02/19/20 2202 02/20/20 0458 02/21/20 0536 02/22/20 0524 02/23/20 0548  NA 137 136 141 139 140  K 4.2 4.8 3.9 3.8 3.8  CL 101 103 106 105 104  CO2 23 21* 24 24 27   GLUCOSE 164* 145* 122* 159* 148*  BUN 26* 23 20 21 16   CREATININE 1.24 1.23 1.06 1.08 0.93  CALCIUM 9.2 8.8* 8.8* 9.0 9.2    GFR: Estimated Creatinine Clearance: 117.3 mL/min (by C-G formula based on SCr of 0.93 mg/dL).  Liver Function Tests: Recent Labs  Lab 02/19/20 2202  AST 22  ALT 11  ALKPHOS 104  BILITOT 0.7  PROT 8.2*  ALBUMIN 2.8*    Coagulation Profile: Recent Labs  Lab 02/20/20 0458  INR 1.1      Recent Results (from the past 240 hour(s))  Blood culture (single)     Status: None   Collection Time: 02/19/20 11:30 PM   Specimen: BLOOD  Result Value Ref Range Status   Specimen Description BLOOD LEFT ANTECUBITAL  Final   Special Requests   Final    BOTTLES DRAWN AEROBIC AND ANAEROBIC Blood Culture adequate volume   Culture  Final    NO GROWTH 5 DAYS Performed at [facility], [address] [place], [place] [address]    Report Status 02/24/2020 FINAL  Final  Urine culture     Status: Abnormal   Collection Time: 02/20/20  2:19 AM   Specimen: In/Out Cath Urine  Result Value Ref Range Status   Specimen Description   Final     IN/OUT CATH URINE Performed at [facility], [address] [place], [place] [address]    Special Requests   Final    NONE Performed at [facility], [address] [place], [place] [address]    Culture 60,000 COLONIES/mL PSEUDOMONAS AERUGINOSA (A)  Final   Report Status 02/23/2020 FINAL  Final   Organism ID, Bacteria PSEUDOMONAS AERUGINOSA (A)  Final      Susceptibility   Pseudomonas aeruginosa - MIC*    CEFTAZIDIME 4 SENSITIVE Sensitive     CIPROFLOXACIN <=0.25 SENSITIVE Sensitive     GENTAMICIN <=1 SENSITIVE Sensitive     IMIPENEM 1 SENSITIVE Sensitive     PIP/TAZO <=4 SENSITIVE Sensitive     CEFEPIME 2 SENSITIVE Sensitive     * 60,000 COLONIES/mL PSEUDOMONAS AERUGINOSA  Respiratory Panel by RT PCR (Flu A&B, Covid) - Nasopharyngeal Swab     Status: None   Collection Time: 02/20/20  4:58 AM   Specimen: Nasopharyngeal Swab  Result Value Ref Range Status   SARS Coronavirus 2 by RT PCR NEGATIVE NEGATIVE Final    Comment: (NOTE) SARS-CoV-2 target nucleic acids are NOT DETECTED.  The SARS-CoV-2 RNA is generally detectable in upper respiratoy specimens during the acute phase of infection. The lowest concentration of SARS-CoV-2 viral copies this assay can detect is 131 copies/mL. A negative result does not preclude SARS-Cov-2 infection and should not be used as the sole basis for treatment or other patient management decisions. A negative result may occur with  improper specimen collection/handling, submission of specimen other than nasopharyngeal swab, presence of viral mutation(s) within the areas targeted by this assay, and inadequate number of viral copies (<131 copies/mL). A negative result must be combined with clinical observations, patient history, and epidemiological information. The expected result is Negative.  Fact Sheet for Patients:  https://www.moore.com/  Fact Sheet for Healthcare Providers:    https://www.young.biz/  This test is no t yet approved or cleared by the [place] FDA and  has been authorized for detection and/or diagnosis of SARS-CoV-2 by FDA under an Emergency Use Authorization (EUA). This EUA will remain  in effect (meaning this test can be used) for the duration of the COVID-19 declaration under Section 564(b)(1) of the Act, 21 U.S.C. section 360bbb-3(b)(1), unless the authorization is terminated or revoked sooner.     Influenza A by PCR NEGATIVE NEGATIVE Final   Influenza B by PCR NEGATIVE NEGATIVE Final    Comment: (NOTE) The Xpert Xpress SARS-CoV-2/FLU/RSV assay is intended as an aid in  the diagnosis of influenza from Nasopharyngeal swab specimens and  should not be used as a sole basis for treatment. Nasal washings and  aspirates are unacceptable for Xpert Xpress SARS-CoV-2/FLU/RSV  testing.  Fact Sheet for Patients: https://www.moore.com/  Fact Sheet for Healthcare Providers: https://www.young.biz/  This test is not yet approved or cleared by the [place] FDA and  has been authorized for detection and/or diagnosis of SARS-CoV-2 by  FDA under an Emergency Use Authorization (EUA). This EUA will remain  in effect (meaning this test can be used) for the duration of the  Covid-19 declaration under Section 564(b)(1) of the Act, 21  U.S.C. section 360bbb-3(b)(1), unless the authorization is  terminated or revoked. Performed at [facility], [address] [place], [place] [address]       Radiology Studies: No results found.     LOS: 6 days   Zoraida Havrilla [employer] on [email or web address]  02/26/2020, 10:22 AM

## 2020-02-26 NOTE — Progress Notes (Signed)
PT Cancellation Note  Patient Details Name: Joseph Potter MRN: [id number] DOB: July 04, 1945   Cancelled Treatment:    Reason Eval/Treat Not Completed: Patient's level of consciousness.  Chart reviewed.  Discussed pt with nurse prior to attempted therapy session.  Pt appearing to be sleeping in bed upon PT arrival.  Pt did not open eyes with cueing and pt would make sounds and say words occasionally (but difficult to understand); pt did not follow any cues.  Pt unable to become alert enough to participate in therapy session.  Per chart, plan for pt to discharge home tomorrow (anticipate with hospice services).  Hendricks Limes, PT 02/26/20, 3:54 PM

## 2020-02-26 NOTE — Progress Notes (Signed)
Civil engineer, contracting hospital Liaison note:  Follow up telephone call to Mrs. Rosebrook to confirm DME delivery. DME to be delivered later this afternoon. Mrs. Brune is planning on patient returning home tomorrow. He will require nonemergent transport with out of facility DNR in place. TOC Stephanie Bowen updated. Will continue to follow thorough discharge.  Dayna Barker BSN, RN, [facility] [employer]  [phone]

## 2020-02-27 DIAGNOSIS — A419 Sepsis, unspecified organism: Principal | ICD-10-CM

## 2020-02-27 NOTE — TOC Progression Note (Signed)
Transition of Care [facility]) - Progression Note    Patient Details  Name: Joseph Potter MRN: [id number] Date of Birth: 03-Oct-1945  [facility] CM/SW Contact  Chapman Fitch, RN Phone Number: 02/27/2020, 11:37 AM  Clinical Narrative:     DME has been delivered to the home Anticipated discharge tomorrow, with hospice admitting him at 2pm Signed DNR on chart.   TOC to arrange EMS transport tomorrow morning   Expected Discharge Plan: Home w Hospice Care Barriers to Discharge: Continued Medical Work up  Expected Discharge Plan and Services Expected Discharge Plan: Home w Hospice Care                                               Social Determinants of Health (SDOH) Interventions    Readmission Risk Interventions No flowsheet data found.

## 2020-02-27 NOTE — Progress Notes (Signed)
PROGRESS NOTE    Joseph Potter  OZH:[id number] DOB: December 17, 1945 DOA: 02/19/2020 PCP: Pcp, No    Brief Narrative:  74 y.o.malewith medical history significant forHypertension and morbid obesitywith history of frequent fallswho was brought in by EMS who was initially called to the home for lift assist but on the evaluation patient was found to be hypotensive at 90/64. Family reports that patient has been a bit confused for the past 24 hours. He has had no fever or chills no cough or shortness of breath, no vomiting diarrhea and no complaints of abdominal pain or chest pain.History limited due to mild confusion.  In the emergency department patient was noted to be normotensive.  He was noted to have leukocytosis.  Chest x-ray showed a large rounded opacity in the left lung.  Hospitalized for acute encephalopathy likely due to infection.  11/3: Patient seen and examined.  Wife at bedside.  Wife reports that this will be arranged for patient to come home with hospice services tomorrow 02/28/2020.  Communicated with TOC and inpatient hospice liaison.   Assessment & Plan:   Principal Problem:   Sepsis (HCC) Active Problems:   Morbid obesity with BMI of 50.0-59.9, adult (HCC)   CAP (community acquired pneumonia)   Mass of upper lobe of left lung   Essential hypertension   Acute metabolic encephalopathy  Acute metabolic encephalopathy in the setting of known history of dementia with behavioral disturbances According to his wife patient has confusion issues at baseline.  Has been diagnosed with dementia.  This has been ongoing for 6 months.  He follows at the [place].  Plan was for full home health services starting soon. It is possible that his acute worsening in mentation could could have been due to UTI.  No focal deficits were noted.  CT head did not show any acute findings.   Patient mentation has not improved much despite treatment with antibiotics.  So it is likely that this is all due to  progression of his dementia.  Patient was started on Seroquel for mood stabilization.  Dose was adjusted.  Patient continues to have episodes of agitation though appears to be less than before.  Continue Haldol as needed.  Continue current dose of Seroquel. Patient was seen by PT and OT.  Skilled nursing facility was recommended..  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  Palliative care was consulted.  He is likely eligible for hospice services as his life expectancy is thought to be 6 months or less.  Plan: Plan discharge home with hospice services 02/28/2020  Tachypnea/low-grade fever Tachypnea resolved.  Still some rhonchorous breath sounds.  Quite possible patient represents a chronic aspiration risk.  Also likely contribution from mass noted on imaging concerning for malignant process.  Continue amoxicillin while admitted.  Will write prescription for 7-day course however defer decision regarding continuation of this medication to hospice representatives  Urinary tract infection Noted to be afebrile with normal heart rate normal respiratory rate and normal saturations.  No evidence for sepsis.  Sepsis ruled out.  Patient was on ceftriaxone and was changed over to cefuroxime.  Urine culture with 60,000 colonies of Pseudomonas.  Sensitivities reviewed.  Cefuroxime was discontinued.   He was given 1 dose of fosfomycin.    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  Patient has a history of tobacco abuse.  Smoked more than 2 to 3 packs of cigarettes daily but has been quit for about 15 to 20 years.   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  Will need biopsy for definitive diagnosis.  This was discussed with the patient's wife.  Considering his progressive dementia he is not be a candidate for further testing and treatment.  She mentioned that the patient would  not want any further testing based on previous conversations. Hospice is following  Essential hypertension Patient was hypotensive initially.  Blood pressure stabilized.  Occasional high readings could be due to agitation.  Noted to be on metoprolol.  Continue current management.  Normocytic anemia Drop in hemoglobin is dilutional.  No overt bleeding was noted.  Morbid obesity Estimated body mass index is 51.54 kg/m as calculated from the following:   Height as of this encounter: 6' (1.829 m).   Weight as of this encounter: 172.4 kg.   DVT prophylaxis: Lovenox Code Status: DNR Family Communication: Wife bedside Disposition Plan: Status is: Inpatient  Remains inpatient appropriate because:Unsafe d/c plan   Dispo:  Patient From: Home  Planned Disposition: Home Hospice  Expected discharge date: 02/28/20  Medically stable for discharge: yes  Patient currently medically stable for discharge.  Pending start of hospice services and delivery of DME to patient's home.  Discharge anticipated 02/28/2020.  Consultants:   None  Procedures:   None  Antimicrobials:   Augmentin   Subjective: Seen and examined.  Lethargic on my evaluation.  Does not participate in interview.  Objective: Vitals:   02/26/20 2359 02/27/20 0346 02/27/20 0750 02/27/20 1122  BP: 134/69 (!) 149/70 (!) 153/84 (!) 134/59  Pulse: 75 77 74 61  Resp: 19 20 20 18   Temp: 98.9 F (37.2 C) 98.6 F (37 C) 98.6 F (37 C) 98.3 F (36.8 C)  TempSrc: Oral Oral Oral Oral  SpO2: 94% 92% 92%   Weight:      Height:        Intake/Output Summary (Last 24 hours) at 02/27/2020 1503 Last data filed at 02/27/2020 1300 Gross per 24 hour  Intake 480 ml  Output --  Net 480 ml   Filed Weights   02/19/20 2158  Weight: (!) 172.4 kg    Examination:  General exam: No acute distress Respiratory system: Bilateral coarse rhonchi.  Normal work of breathing.  Room air Cardiovascular system: S1-S2 heard, regular  rate and rhythm, 2/6 systolic murmur gastrointestinal system: Obese, nontender, nondistended, normal bowel sounds Central nervous system: Lethargic, unable to assess orientation, no focal deficits  Extremities: Diffusely decreased power bilaterally Skin: No rashes, lesions or ulcers Psychiatry: Judgement and insight appear impaired. Mood & affect flattened.     Data Reviewed: I have personally reviewed following labs and imaging studies  CBC: Recent Labs  Lab 02/21/20 0536 02/22/20 0524 02/23/20 0548 02/26/20 1118  WBC 10.3 9.8 10.1 17.1*  HGB 9.8* 9.9* 10.6* 10.7*  HCT 28.9* 29.6* 32.6* 32.9*  MCV 90.9 91.9 92.4 92.7  PLT 334 346 361 425*   Basic Metabolic Panel: Recent Labs  Lab 02/21/20 0536 02/22/20 0524 02/23/20 0548 02/26/20 1118  NA 141 139 140 138  K 3.9 3.8 3.8 4.4  CL 106 105 104 102  CO2 24 24 27 25   GLUCOSE 122* 159* 148* 155*  BUN 20 21 16 17   CREATININE 1.06 1.08 0.93 1.16  CALCIUM 8.8* 9.0 9.2 9.1   GFR: Estimated Creatinine Clearance: 94 mL/min (by C-G formula based on SCr of 1.16 mg/dL). Liver Function Tests:  No results for input(s): AST, ALT, ALKPHOS, BILITOT, PROT, ALBUMIN in the last 168 hours. No results for input(s): LIPASE, AMYLASE in the last 168 hours. No results for input(s): AMMONIA in the last 168 hours. Coagulation Profile: No results for input(s): INR, PROTIME in the last 168 hours. Cardiac Enzymes: No results for input(s): CKTOTAL, CKMB, CKMBINDEX, TROPONINI in the last 168 hours. BNP (last 3 results) No results for input(s): PROBNP in the last 8760 hours. HbA1C: No results for input(s): HGBA1C in the last 72 hours. CBG: No results for input(s): GLUCAP in the last 168 hours. Lipid Profile: No results for input(s): CHOL, HDL, LDLCALC, TRIG, CHOLHDL, LDLDIRECT in the last 72 hours. Thyroid Function Tests: No results for input(s): TSH, T4TOTAL, FREET4, T3FREE, THYROIDAB in the last 72 hours. Anemia Panel: No results for input(s):  VITAMINB12, FOLATE, FERRITIN, TIBC, IRON, RETICCTPCT in the last 72 hours. Sepsis Labs: No results for input(s): PROCALCITON, LATICACIDVEN in the last 168 hours.  Recent Results (from the past 240 hour(s))  Blood culture (single)     Status: None   Collection Time: 02/19/20 11:30 PM   Specimen: BLOOD  Result Value Ref Range Status   Specimen Description BLOOD LEFT ANTECUBITAL  Final   Special Requests   Final    BOTTLES DRAWN AEROBIC AND ANAEROBIC Blood Culture adequate volume   Culture   Final    NO GROWTH 5 DAYS Performed at [facility], [address] [place], [place] [address]    Report Status 02/24/2020 FINAL  Final  Urine culture     Status: Abnormal   Collection Time: 02/20/20  2:19 AM   Specimen: In/Out Cath Urine  Result Value Ref Range Status   Specimen Description   Final    IN/OUT CATH URINE Performed at [facility], [address] [place], [place] [address]    Special Requests   Final    NONE Performed at [facility], [address] [place], [place] [address]    Culture 60,000 COLONIES/mL PSEUDOMONAS AERUGINOSA (A)  Final   Report Status 02/23/2020 FINAL  Final   Organism ID, Bacteria PSEUDOMONAS AERUGINOSA (A)  Final      Susceptibility   Pseudomonas aeruginosa - MIC*    CEFTAZIDIME 4 SENSITIVE Sensitive     CIPROFLOXACIN <=0.25 SENSITIVE Sensitive     GENTAMICIN <=1 SENSITIVE Sensitive     IMIPENEM 1 SENSITIVE Sensitive     PIP/TAZO <=4 SENSITIVE Sensitive     CEFEPIME 2 SENSITIVE Sensitive     * 60,000 COLONIES/mL PSEUDOMONAS AERUGINOSA  Respiratory Panel by RT PCR (Flu A&B, Covid) - Nasopharyngeal Swab     Status: None   Collection Time: 02/20/20  4:58 AM   Specimen: Nasopharyngeal Swab  Result Value Ref Range Status   SARS Coronavirus 2 by RT PCR NEGATIVE NEGATIVE Final    Comment: (NOTE) SARS-CoV-2 target nucleic acids are NOT DETECTED.  The SARS-CoV-2 RNA is generally detectable in upper respiratoy specimens  during the acute phase of infection. The lowest concentration of SARS-CoV-2 viral copies this assay can detect is 131 copies/mL. A negative result does not preclude SARS-Cov-2 infection and should not be used as the sole basis for treatment or other patient management decisions. A negative result may occur with  improper specimen collection/handling, submission of specimen other than nasopharyngeal swab, presence of viral mutation(s) within the areas targeted by this assay, and inadequate number of viral copies (<131 copies/mL). A negative result must be combined with clinical observations, patient history,  and epidemiological information. The expected result is Negative.  Fact Sheet for Patients:  https://www.moore.com/  Fact Sheet for Healthcare Providers:  https://www.young.biz/  This test is no t yet approved or cleared by the [place] FDA and  has been authorized for detection and/or diagnosis of SARS-CoV-2 by FDA under an Emergency Use Authorization (EUA). This EUA will remain  in effect (meaning this test can be used) for the duration of the COVID-19 declaration under Section 564(b)(1) of the Act, 21 U.S.C. section 360bbb-3(b)(1), unless the authorization is terminated or revoked sooner.     Influenza A by PCR NEGATIVE NEGATIVE Final   Influenza B by PCR NEGATIVE NEGATIVE Final    Comment: (NOTE) The Xpert Xpress SARS-CoV-2/FLU/RSV assay is intended as an aid in  the diagnosis of influenza from Nasopharyngeal swab specimens and  should not be used as a sole basis for treatment. Nasal washings and  aspirates are unacceptable for Xpert Xpress SARS-CoV-2/FLU/RSV  testing.  Fact Sheet for Patients: https://www.moore.com/  Fact Sheet for Healthcare Providers: https://www.young.biz/  This test is not yet approved or cleared by the [place] FDA and  has been authorized for detection and/or  diagnosis of SARS-CoV-2 by  FDA under an Emergency Use Authorization (EUA). This EUA will remain  in effect (meaning this test can be used) for the duration of the  Covid-19 declaration under Section 564(b)(1) of the Act, 21  U.S.C. section 360bbb-3(b)(1), unless the authorization is  terminated or revoked. Performed at [facility], [address] [place], [place] [address]          Radiology Studies: DG Chest Fries 1 View  Result Date: 02/26/2020 CLINICAL DATA:  Wheezing, fever, LEFT upper lobe mass, hypertension EXAM: PORTABLE CHEST 1 VIEW COMPARISON:  Portable exam 1044 hours compared to 02/21/2020 Correlation: CT chest 02/20/2020 FINDINGS: Normal heart size, mediastinal contours, and pulmonary vascularity. Slightly rotated to RIGHT. Large LEFT upper lobe mass again identified. Generally decreased lung volumes with mild RIGHT basilar atelectasis. No acute infiltrate, pleural effusion or pneumothorax. IMPRESSION: Persistent RIGHT basilar atelectasis. LEFT upper lobe mass again identified; please refer to recent CT. Electronically Signed   By: Ulyses Southward M.D.   On: 02/26/2020 11:02        Scheduled Meds: . allopurinol  100 mg Oral BID  . amitriptyline  50 mg Oral QHS  . amoxicillin-clavulanate  1 tablet Oral Q12H  . atorvastatin  20 mg Oral QHS  . donepezil  10 mg Oral QHS  . LORazepam  1 mg Intravenous Once  . melatonin  5 mg Oral QHS  . metoprolol tartrate  25 mg Oral BID  . QUEtiapine  75 mg Oral BID   Continuous Infusions:   LOS: 7 days    Time spent: 15 minutes    Tresa Moore, MD [facility] Pager [phone]  If 7PM-7AM, please contact night-coverage 02/27/2020, 3:03 PM

## 2020-02-27 NOTE — Progress Notes (Signed)
[facility] hospital liaison:  Follow up telephone call made to Joseph Potter to confirm discharge plan for tomorrow. Hospice admission is scheduled for tomorrow 11/4 at 2 pm. TOC Bevelyn Ngo aware and will arrange transport in the morning. Referral updated. Will continue to follow through discharge.  Dayna Barker BSN, RN, [facility] [employer] [phone]

## 2020-02-28 MED ORDER — QUETIAPINE FUMARATE 25 MG PO TABS: 75.0000 mg | ORAL_TABLET | Freq: Two times a day (BID) | ORAL | 0 refills | Status: AC

## 2020-02-28 MED ORDER — AMOXICILLIN-POT CLAVULANATE 875-125 MG PO TABS: 1.0000 | ORAL_TABLET | Freq: Two times a day (BID) | ORAL | 0 refills | Status: DC

## 2020-02-28 MED ORDER — QUETIAPINE FUMARATE 25 MG PO TABS: 75.0000 mg | ORAL_TABLET | Freq: Two times a day (BID) | ORAL | 0 refills | Status: DC

## 2020-02-28 MED ORDER — LORAZEPAM 0.5 MG PO TABS: 0.5000 mg | ORAL_TABLET | Freq: Three times a day (TID) | ORAL | 0 refills | Status: DC | PRN

## 2020-02-28 MED ORDER — AMOXICILLIN-POT CLAVULANATE 875-125 MG PO TABS: 1.0000 | ORAL_TABLET | Freq: Two times a day (BID) | ORAL | 0 refills | Status: AC

## 2020-02-28 MED ORDER — LORAZEPAM 0.5 MG PO TABS: 0.5000 mg | ORAL_TABLET | Freq: Three times a day (TID) | ORAL | 0 refills | Status: AC | PRN

## 2020-02-28 NOTE — Progress Notes (Signed)
[facility], [facility] hospital Liaison note:  Follow up visit to new referral for [facility] hospice services at home. TOC Judeth Cornfield has arrange EMS transport with plan for patient to arrive home by 2 pm for hospice admission. Signed out of facility DNR in place in discharge packet.  Dayna Barker BSN, RN, [facility] [employer] [phone]

## 2020-02-28 NOTE — Discharge Summary (Signed)
Physician Discharge Summary  Joseph Potter YSA:[id number] DOB: [birth year] DOA: 02/19/2020  PCP: Oneita Hurt, No  Admit date: 02/19/2020 Discharge date: 02/28/2020  Admitted From: Home Disposition: Home with hospice services  Recommendations for Outpatient Follow-up:  1. Follow any recommendations of hospice medical staff  Home Health: No Equipment/Devices: None Discharge Condition: Hospice CODE STATUS: DNR Diet recommendation: Dysphagia   Brief/Interim Summary: 74 y.o.malewith medical history significant forHypertension and morbid obesitywith history of frequent fallswho was brought in by EMS who was initially called to the home for lift assist but on the evaluation patient was found to be hypotensive at 90/64. Family reports that patient has been a bit confused for the past 24 hours. He has had no fever or chills no cough or shortness of breath, no vomiting diarrhea and no complaints of abdominal pain or chest pain.History limited due to mild confusion. In the emergency department patient was noted to be normotensive. He was noted to have leukocytosis. Chest x-ray showed a large rounded opacity in the left lung. Hospitalized for acute encephalopathy likely due to infection.  11/3: Patient seen and examined.  Wife at bedside.  Wife reports that this will be arranged for patient to come home with hospice services tomorrow 02/28/2020.  Communicated with TOC and inpatient hospice liaison.  11/4: Patient seen and examined on the morning of discharge.,  Stable from a hemodynamic standpoint.  Remains somewhat lethargic.  Patient stable for discharge to home with hospice services.  TOC and hospice liaison aware of discharge plan.  Prescription for Seroquel, Augmentin, Ativan sent to outpatient pharmacy.  Remainder of medications and care defer to outpatient hospice staff.   Discharge Diagnoses:  Principal Problem:   Sepsis (HCC) Active Problems:   Morbid obesity with BMI of 50.0-59.9,  adult (HCC)   CAP (community acquired pneumonia)   Mass of upper lobe of left lung   Essential hypertension   Acute metabolic encephalopathy  Acute metabolic encephalopathy in the setting of known history of dementia with behavioral disturbances According to his wife patient has confusion issues at baseline. Has been diagnosed with dementia. This has been ongoing for 6 months. He follows at the [place]. Plan was for full home health services starting soon. It is possible that his acute worsening in mentation could could have been due to UTI. No focal deficits were noted. CT head did not show any acute findings.  Patient mentation has not improved much despite treatment with antibiotics. So it is likely that this is all due to progression of his dementia. Patient was started on Seroquel for mood stabilization. Dose was adjusted. Patient continues to have episodes of agitation though appears to be less than before. Continue Haldol as needed. Continue current dose of Seroquel. Patient was seen by PT and OT. Skilled nursing facility was recommended.. 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 Palliative care was consulted. He is likely eligible for hospice services as his life expectancy is thought to be 6 months or less.  Plan: Plan discharge home with hospice services 02/28/2020 Prescription for as needed Seroquel, 1 week supply sent to outpatient pharmacy.  Dosing adjusted per inpatient psychiatry  Tachypnea/low-grade fever Tachypnea resolved.  Still some rhonchorous breath sounds.  Quite possible patient represents a chronic aspiration risk.  Also likely contribution from mass noted on imaging concerning for malignant process.  Continue amoxicillin while admitted.  Will write prescription for 7-day course however defer  decision regarding continuation of this medication to hospice  representatives  Urinary tract infection Noted to be afebrile with normal heart rate normal respiratory rate and normal saturations. No evidence for sepsis. Sepsis ruled out.  Patient was on ceftriaxone and was changed over to cefuroxime. Urine culture with 60,000 colonies of Pseudomonas. Sensitivities reviewed. Cefuroxime was discontinued.  He was given 1 dose of fosfomycin.   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 Patient has a history of tobacco abuse. Smoked more than 2 to 3 packs of cigarettes daily but has been quit for about 15 to 20 years.  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 Will need biopsy for definitive diagnosis. This was discussed with the patient's wife. Considering his progressive dementia he is not be a candidate for further testing and treatment. She mentioned that the patient would not want any further testing based on previous conversations. Hospice is following  Essential hypertension Patient was hypotensive initially. Blood pressure stabilized. Occasional high readings could be due to agitation. Noted to be on metoprolol. Continue current management.  Normocytic anemia Drop in hemoglobin is dilutional.No overt bleeding was noted.  Morbid obesity Estimated body mass index is 51.54 kg/m as calculated from the following: Height as of this encounter: 6' (1.829 m). Weight as of this encounter: 172.4 kg.  Discharge Instructions  Discharge Instructions    Diet - low sodium heart healthy   Complete by: As directed    Increase activity slowly   Complete by: As directed      Allergies as of 02/28/2020   No Known Allergies     Medication List    TAKE these medications   acetaminophen 325 MG tablet Commonly known as: TYLENOL Take 650 mg by mouth every 6 (six) hours as needed.   allopurinol 100 MG tablet Commonly known as: ZYLOPRIM Take 100 mg by mouth 2 (two) times daily.   amitriptyline 25 MG  tablet Commonly known as: ELAVIL Take 50 mg by mouth at bedtime.   amLODipine 10 MG tablet Commonly known as: NORVASC Take 5 mg by mouth daily.   amoxicillin-clavulanate 875-125 MG tablet Commonly known as: AUGMENTIN Take 1 tablet by mouth every 12 (twelve) hours for 4 days.   atorvastatin 40 MG tablet Commonly known as: LIPITOR Take 20 mg by mouth daily.   bacitracin 500 UNIT/GM ointment Apply 1 application topically 2 (two) times daily.   cetirizine 10 MG tablet Commonly known as: ZYRTEC Take 10 mg by mouth daily.   cholecalciferol 25 MCG (1000 UNIT) tablet Commonly known as: VITAMIN D Take 1,000 Units by mouth daily.   colchicine 0.6 MG tablet Take 0.6 mg by mouth as directed.   donepezil 10 MG tablet Commonly known as: ARICEPT Take 10 mg by mouth at bedtime.   ketoconazole 2 % shampoo Commonly known as: NIZORAL Apply 1 application topically 2 (two) times a week.   lisinopril 40 MG tablet Commonly known as: ZESTRIL Take 40 mg by mouth daily.   LORazepam 0.5 MG tablet Commonly known as: ATIVAN Take 1 tablet (0.5 mg total) by mouth 3 (three) times daily as needed for up to 3 days for anxiety.   magnesium oxide 400 MG tablet Commonly known as: MAG-OX Take 400 mg by mouth daily.   melatonin 3 MG Tabs tablet Take 3 mg by mouth at bedtime.   metFORMIN 1000 MG tablet Commonly known as: GLUCOPHAGE Take 1,000 mg by mouth 2 (two) times daily with a meal.  metoprolol tartrate 25 MG tablet Commonly known as: LOPRESSOR Take 25 mg by mouth 2 (two) times daily.   naproxen 500 MG tablet Commonly known as: NAPROSYN Take 500 mg by mouth 2 (two) times daily with a meal.   NO STING BARRIER FILM Misc Apply 1 each topically 2 (two) times daily.   QUEtiapine 25 MG tablet Commonly known as: SEROQUEL Take 3 tablets (75 mg total) by mouth 2 (two) times daily for 7 days.   triamcinolone ointment 0.1 % Commonly known as: KENALOG Apply 1 application topically 2 (two)  times daily.       No Known Allergies  Consultations:  None   Procedures/Studies: CT Head Wo Contrast  Result Date: 02/19/2020 CLINICAL DATA:  74 year old male with altered mental status. EXAM: CT HEAD WITHOUT CONTRAST TECHNIQUE: Contiguous axial images were obtained from the base of the skull through the vertex without intravenous contrast. COMPARISON:  None. FINDINGS: Brain: Mild age-related atrophy and chronic microvascular ischemic changes. There is no acute intracranial hemorrhage. No mass effect or midline shift no extra-axial fluid collection. Vascular: No hyperdense vessel or unexpected calcification. Skull: Normal. Negative for fracture or focal lesion. Sinuses/Orbits: No acute finding. Other: None IMPRESSION: No acute intracranial pathology. Electronically Signed   By: Elgie Collard M.D.   On: 02/19/2020 23:10   CT Chest Wo Contrast  Result Date: 02/20/2020 CLINICAL DATA:  Chest pain EXAM: CT CHEST WITHOUT CONTRAST TECHNIQUE: Multidetector CT imaging of the chest was performed following the standard protocol without IV contrast. COMPARISON:  None. FINDINGS: Cardiovascular: Calcific aortic atherosclerosis. Coronary artery atherosclerosis. No pericardial effusion. Mediastinum/Nodes: No enlarged mediastinal or axillary lymph nodes. Thyroid gland, trachea, and esophagus demonstrate no significant findings. Lungs/Pleura: 6.7 x 6.0 cm mass in the left upper lobe. Upper Abdomen: No acute abnormality. Musculoskeletal: Flowing anterior osteophytes along the lower thoracic spine. IMPRESSION: 1. A 6.7 x 6.0 cm mass in the left upper lobe. 2. Coronary artery and aortic Atherosclerosis (ICD10-I70.0). Electronically Signed   By: Deatra Robinson M.D.   On: 02/20/2020 01:23   DG Chest Port 1 View  Result Date: 02/26/2020 CLINICAL DATA:  Wheezing, fever, LEFT upper lobe mass, hypertension EXAM: PORTABLE CHEST 1 VIEW COMPARISON:  Portable exam 1044 hours compared to 02/21/2020 Correlation: CT chest  02/20/2020 FINDINGS: Normal heart size, mediastinal contours, and pulmonary vascularity. Slightly rotated to RIGHT. Large LEFT upper lobe mass again identified. Generally decreased lung volumes with mild RIGHT basilar atelectasis. No acute infiltrate, pleural effusion or pneumothorax. IMPRESSION: Persistent RIGHT basilar atelectasis. LEFT upper lobe mass again identified; please refer to recent CT. Electronically Signed   By: Ulyses Southward M.D.   On: 02/26/2020 11:02   DG Chest Port 1 View  Result Date: 02/21/2020 CLINICAL DATA:  CT chest 02/20/2020 EXAM: PORTABLE CHEST 1 VIEW COMPARISON:  CT chest 02/20/2020, chest radiograph 02/19/2020. FINDINGS: Similar mass in the left upper lobe, better characterized on recent CT chest. Low lung volumes without new consolidation. No visible pleural effusions or pneumothorax. Similar cardiomediastinal contour. Aortic atherosclerosis. No acute osseous abnormality. IMPRESSION: Similar mass in the left upper lobe, better characterized on recent CT chest. No new acute abnormality. Electronically Signed   By: Feliberto Harts MD   On: 02/21/2020 10:53   DG Chest Port 1 View  Result Date: 02/19/2020 CLINICAL DATA:  Hypotensive altered EXAM: PORTABLE CHEST 1 VIEW COMPARISON:  None. FINDINGS: Low lung volumes. Rounded opacity in the left mid to upper lung. Enlarged cardiomediastinal silhouette. No pneumothorax. Ovoid opacity in the right  hilus. IMPRESSION: Low lung volumes with large rounded opacity in the left mid to upper lung, pneumonia versus mass. Consider chest CT for further evaluation. Ovoid opacity in the right hilus could reflect vessel on end versus hilar node. This could also be assessed at CT. Electronically Signed   By: Jasmine Pang M.D.   On: 02/19/2020 23:02    (Echo, Carotid, EGD, Colonoscopy, ERCP)    Subjective: Seen and examined on day of discharge.  Hemodynamically stable.  No pain endorsed.  Discharge Exam: Vitals:   02/28/20 0436 02/28/20 0800   BP: 126/80 111/90  Pulse: 69 67  Resp: 20 18  Temp: 98.1 F (36.7 C) 99.4 F (37.4 C)  SpO2: 92% 99%   Vitals:   02/27/20 2223 02/28/20 0019 02/28/20 0436 02/28/20 0800  BP: (!) 142/69 134/70 126/80 111/90  Pulse: 70 66 69 67  Resp:  18 20 18   Temp:  98.7 F (37.1 C) 98.1 F (36.7 C) 99.4 F (37.4 C)  TempSrc:  Oral Oral   SpO2:  98% 92% 99%  Weight:      Height:        General: Lethargic Cardiovascular: RRR, S1/S2 +, no rubs, no gallops Respiratory: Bilateral rhonchi.  Poor respiratory effort.  Room air Abdominal: Obese, soft, NT, ND, bowel sounds + Extremities: no edema, no cyanosis    The results of significant diagnostics from this hospitalization (including imaging, microbiology, ancillary and laboratory) are listed below for reference.     Microbiology: Recent Results (from the past 240 hour(s))  Blood culture (single)     Status: None   Collection Time: 02/19/20 11:30 PM   Specimen: BLOOD  Result Value Ref Range Status   Specimen Description BLOOD LEFT ANTECUBITAL  Final   Special Requests   Final    BOTTLES DRAWN AEROBIC AND ANAEROBIC Blood Culture adequate volume   Culture   Final    NO GROWTH 5 DAYS Performed at [facility], [address] [place], [place]    Report Status 02/24/2020 FINAL  Final  Urine culture     Status: Abnormal   Collection Time: 02/20/20  2:19 AM   Specimen: In/Out Cath Urine  Result Value Ref Range Status   Specimen Description   Final    IN/OUT CATH URINE Performed at [facility], [address] [place], [place]    Special Requests   Final    NONE Performed at [facility], [address], [place], [place]    Culture 60,000 COLONIES/mL PSEUDOMONAS AERUGINOSA (A)  Final   Report Status 02/23/2020 FINAL  Final   Organism ID, Bacteria PSEUDOMONAS AERUGINOSA (A)  Final      Susceptibility   Pseudomonas aeruginosa - MIC*    CEFTAZIDIME 4 SENSITIVE Sensitive      CIPROFLOXACIN <=0.25 SENSITIVE Sensitive     GENTAMICIN <=1 SENSITIVE Sensitive     IMIPENEM 1 SENSITIVE Sensitive     PIP/TAZO <=4 SENSITIVE Sensitive     CEFEPIME 2 SENSITIVE Sensitive     * 60,000 COLONIES/mL PSEUDOMONAS AERUGINOSA  Respiratory Panel by RT PCR (Flu A&B, Covid) - Nasopharyngeal Swab     Status: None   Collection Time: 02/20/20  4:58 AM   Specimen: Nasopharyngeal Swab  Result Value Ref Range Status   SARS Coronavirus 2 by RT PCR NEGATIVE NEGATIVE Final    Comment: (NOTE) SARS-CoV-2 target nucleic acids are NOT DETECTED.  The SARS-CoV-2 RNA is generally detectable in upper respiratoy specimens  during the acute phase of infection. The lowest concentration of SARS-CoV-2 viral copies this assay can detect is 131 copies/mL. A negative result does not preclude SARS-Cov-2 infection and should not be used as the sole basis for treatment or other patient management decisions. A negative result may occur with  improper specimen collection/handling, submission of specimen other than nasopharyngeal swab, presence of viral mutation(s) within the areas targeted by this assay, and inadequate number of viral copies (<131 copies/mL). A negative result must be combined with clinical observations, patient history, and epidemiological information. The expected result is Negative.  Fact Sheet for Patients:  https://www.moore.com/  Fact Sheet for Healthcare Providers:  https://www.young.biz/  This test is no t yet approved or cleared by the [place] FDA and  has been authorized for detection and/or diagnosis of SARS-CoV-2 by FDA under an Emergency Use Authorization (EUA). This EUA will remain  in effect (meaning this test can be used) for the duration of the COVID-19 declaration under Section 564(b)(1) of the Act, 21 U.S.C. section 360bbb-3(b)(1), unless the authorization is terminated or revoked sooner.     Influenza A by PCR  NEGATIVE NEGATIVE Final   Influenza B by PCR NEGATIVE NEGATIVE Final    Comment: (NOTE) The Xpert Xpress SARS-CoV-2/FLU/RSV assay is intended as an aid in  the diagnosis of influenza from Nasopharyngeal swab specimens and  should not be used as a sole basis for treatment. Nasal washings and  aspirates are unacceptable for Xpert Xpress SARS-CoV-2/FLU/RSV  testing.  Fact Sheet for Patients: https://www.moore.com/  Fact Sheet for Healthcare Providers: https://www.young.biz/  This test is not yet approved or cleared by the [place] FDA and  has been authorized for detection and/or diagnosis of SARS-CoV-2 by  FDA under an Emergency Use Authorization (EUA). This EUA will remain  in effect (meaning this test can be used) for the duration of the  Covid-19 declaration under Section 564(b)(1) of the Act, 21  U.S.C. section 360bbb-3(b)(1), unless the authorization is  terminated or revoked. Performed at [facility], [address] [place], [place] [address]      Labs: BNP (last 3 results) Recent Labs    02/20/20 0458  BNP 126.4*   Basic Metabolic Panel: Recent Labs  Lab 02/22/20 0524 02/23/20 0548 02/26/20 1118  NA 139 140 138  K 3.8 3.8 4.4  CL 105 104 102  CO2 GLUCOSE 159* 148* 155*  BUN CREATININE 1.08 0.93 1.16  CALCIUM 9.0 9.2 9.1   Liver Function Tests: No results for input(s): AST, ALT, ALKPHOS, BILITOT, PROT, ALBUMIN in the last 168 hours. No results for input(s): LIPASE, AMYLASE in the last 168 hours. No results for input(s): AMMONIA in the last 168 hours. CBC: Recent Labs  Lab 02/22/20 0524 02/23/20 0548 02/26/20 1118  WBC 9.8 10.1 17.1*  HGB 9.9* 10.6* 10.7*  HCT 29.6* 32.6* 32.9*  MCV 91.9 92.4 92.7  PLT 346 361 425*   Cardiac Enzymes: No results for input(s): CKTOTAL, CKMB, CKMBINDEX, TROPONINI in the last 168 hours. BNP: Invalid input(s): POCBNP CBG: No results for  input(s): GLUCAP in the last 168 hours. D-Dimer No results for input(s): DDIMER in the last 72 hours. Hgb A1c No results for input(s): HGBA1C in the last 72 hours. Lipid Profile No results for input(s): CHOL, HDL, LDLCALC, TRIG, CHOLHDL, LDLDIRECT in the last 72 hours. Thyroid function studies No results for input(s): TSH, T4TOTAL, T3FREE, THYROIDAB in the last 72 hours.  Invalid input(s):  FREET3 Anemia work up No results for input(s): VITAMINB12, FOLATE, FERRITIN, TIBC, IRON, RETICCTPCT in the last 72 hours. Urinalysis    Component Value Date/Time   COLORURINE YELLOW (A) 02/20/2020 0219   APPEARANCEUR CLOUDY (A) 02/20/2020 0219   LABSPEC 1.015 02/20/2020 0219   PHURINE 5.0 02/20/2020 0219   GLUCOSEU NEGATIVE 02/20/2020 0219   HGBUR NEGATIVE 02/20/2020 0219   BILIRUBINUR NEGATIVE 02/20/2020 0219   KETONESUR 5 (A) 02/20/2020 0219   PROTEINUR NEGATIVE 02/20/2020 0219   NITRITE NEGATIVE 02/20/2020 0219   LEUKOCYTESUR SMALL (A) 02/20/2020 0219   Sepsis Labs Invalid input(s): PROCALCITONIN,  WBC,  LACTICIDVEN Microbiology Recent Results (from the past 240 hour(s))  Blood culture (single)     Status: None   Collection Time: 02/19/20 11:30 PM   Specimen: BLOOD  Result Value Ref Range Status   Specimen Description BLOOD LEFT ANTECUBITAL  Final   Special Requests   Final    BOTTLES DRAWN AEROBIC AND ANAEROBIC Blood Culture adequate volume   Culture   Final    NO GROWTH 5 DAYS Performed at [facility], [address] [place], [place] [address]    Report Status 02/24/2020 FINAL  Final  Urine culture     Status: Abnormal   Collection Time: 02/20/20  2:19 AM   Specimen: In/Out Cath Urine  Result Value Ref Range Status   Specimen Description   Final    IN/OUT CATH URINE Performed at [facility], [address] [place], [place] [address]    Special Requests   Final    NONE Performed at [facility], [address] [place], [place] [address]     Culture 60,000 COLONIES/mL PSEUDOMONAS AERUGINOSA (A)  Final   Report Status 02/23/2020 FINAL  Final   Organism ID, Bacteria PSEUDOMONAS AERUGINOSA (A)  Final      Susceptibility   Pseudomonas aeruginosa - MIC*    CEFTAZIDIME 4 SENSITIVE Sensitive     CIPROFLOXACIN <=0.25 SENSITIVE Sensitive     GENTAMICIN <=1 SENSITIVE Sensitive     IMIPENEM 1 SENSITIVE Sensitive     PIP/TAZO <=4 SENSITIVE Sensitive     CEFEPIME 2 SENSITIVE Sensitive     * 60,000 COLONIES/mL PSEUDOMONAS AERUGINOSA  Respiratory Panel by RT PCR (Flu A&B, Covid) - Nasopharyngeal Swab     Status: None   Collection Time: 02/20/20  4:58 AM   Specimen: Nasopharyngeal Swab  Result Value Ref Range Status   SARS Coronavirus 2 by RT PCR NEGATIVE NEGATIVE Final    Comment: (NOTE) SARS-CoV-2 target nucleic acids are NOT DETECTED.  The SARS-CoV-2 RNA is generally detectable in upper respiratoy specimens during the acute phase of infection. The lowest concentration of SARS-CoV-2 viral copies this assay can detect is 131 copies/mL. A negative result does not preclude SARS-Cov-2 infection and should not be used as the sole basis for treatment or other patient management decisions. A negative result may occur with  improper specimen collection/handling, submission of specimen other than nasopharyngeal swab, presence of viral mutation(s) within the areas targeted by this assay, and inadequate number of viral copies (<131 copies/mL). A negative result must be combined with clinical observations, patient history, and epidemiological information. The expected result is Negative.  Fact Sheet for Patients:  https://www.moore.com/  Fact Sheet for Healthcare Providers:  https://www.young.biz/  This test is no t yet approved or cleared by the [place] FDA and  has been authorized for detection and/or diagnosis of SARS-CoV-2 by FDA under an Emergency Use Authorization (EUA). This  EUA will  remain  in effect (meaning this test can be used) for the duration of the COVID-19 declaration under Section 564(b)(1) of the Act, 21 U.S.C. section 360bbb-3(b)(1), unless the authorization is terminated or revoked sooner.     Influenza A by PCR NEGATIVE NEGATIVE Final   Influenza B by PCR NEGATIVE NEGATIVE Final    Comment: (NOTE) The Xpert Xpress SARS-CoV-2/FLU/RSV assay is intended as an aid in  the diagnosis of influenza from Nasopharyngeal swab specimens and  should not be used as a sole basis for treatment. Nasal washings and  aspirates are unacceptable for Xpert Xpress SARS-CoV-2/FLU/RSV  testing.  Fact Sheet for Patients: https://www.moore.com/https://www.fda.gov/media/142436/download  Fact Sheet for Healthcare Providers: https://www.young.biz/https://www.fda.gov/media/142435/download  This test is not yet approved or cleared by the [place] FDA and  has been authorized for detection and/or diagnosis of SARS-CoV-2 by  FDA under an Emergency Use Authorization (EUA). This EUA will remain  in effect (meaning this test can be used) for the duration of the  Covid-19 declaration under Section 564(b)(1) of the Act, 21  U.S.C. section 360bbb-3(b)(1), unless the authorization is  terminated or revoked. Performed at [facility], [address] [place], [place] [id number]      Time coordinating discharge: Over 30 minutes  SIGNED:   Tresa MooreSudheer B Jameshia Hayashida, MD  [facility] 02/28/2020, 10:04 AM Pager   If 7PM-7AM, please contact night-coverage

## 2020-02-28 NOTE — TOC Transition Note (Signed)
Transition of Care [facility]) - CM/SW Discharge Note   Patient Details  Name: Joseph Potter MRN: [id number] Date of Birth: Aug 31, 1945  [facility] CM/SW Contact:  Chapman Fitch, RN Phone Number: 02/28/2020, 10:55 AM   Clinical Narrative:      Patient to discharge home today with hospice services Clydie Braun with [facility] aware  Barbara Cower with [facility] notified EMS packet on chart EMS transport called  Hospice to admit at home at 12 pm MD to electronically send prescriptions for new medications to [facility]   Final next level of care: Home w Hospice Care Barriers to Discharge: No Barriers Identified   Patient Goals and CMS Choice        Discharge Placement                  Name of family member notified: Ms chagoya Patient and family notified of of transfer: 02/28/20  Discharge Plan and Services                                     Social Determinants of Health (SDOH) Interventions     Readmission Risk Interventions No flowsheet data found.

## 2020-03-26 DEATH — deceased

## 2021-01-09 IMAGING — CT CT HEAD W/O CM
3 series · 15 of 47 positions shown, 18 images · non-contrast
Comparison: None.

CLINICAL DATA: 74-year-old male with altered mental status.

EXAM:
CT HEAD WITHOUT CONTRAST
TECHNIQUE: Contiguous axial images were obtained from the base of the skull
through the vertex without intravenous contrast.

[Series 3: head wo · axial · 0.47mm/px · z∈[-82,+48]mm · 9 of 32 slices shown, 12 images]
[im 3/32  brain]
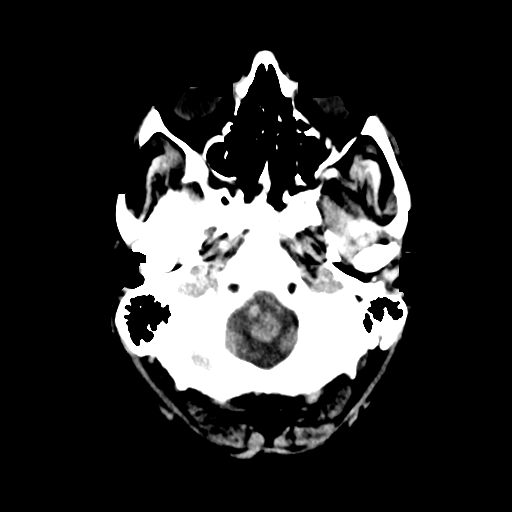
[im 3/32  bone]
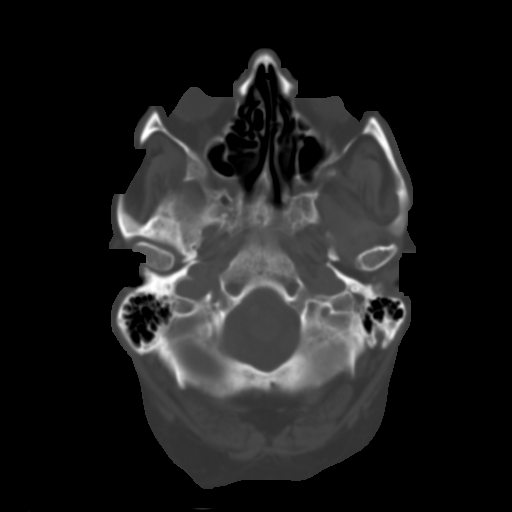
[im 6/32  brain]
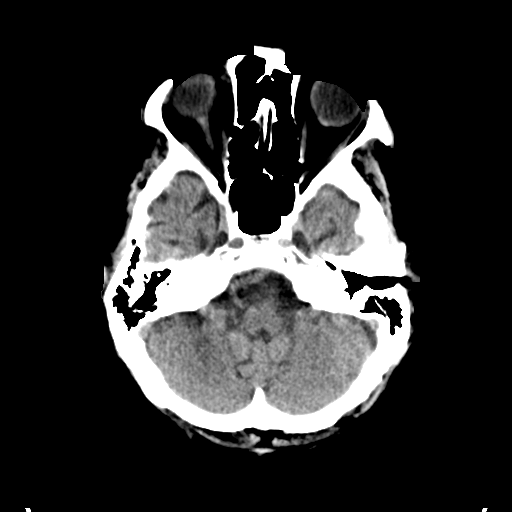
[im 9/32  brain]
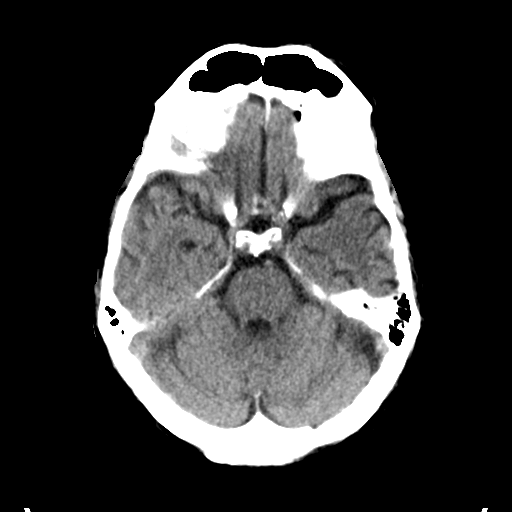
[im 12/32  brain]
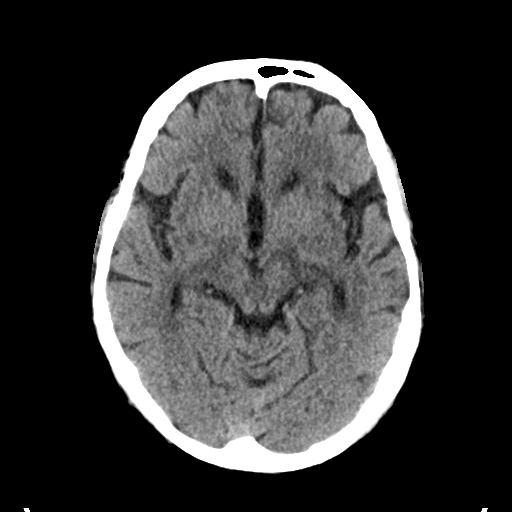
[im 17/32  brain]
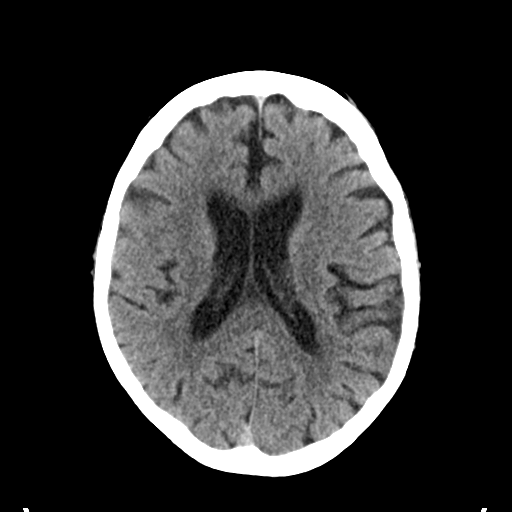
[im 17/32  bone]
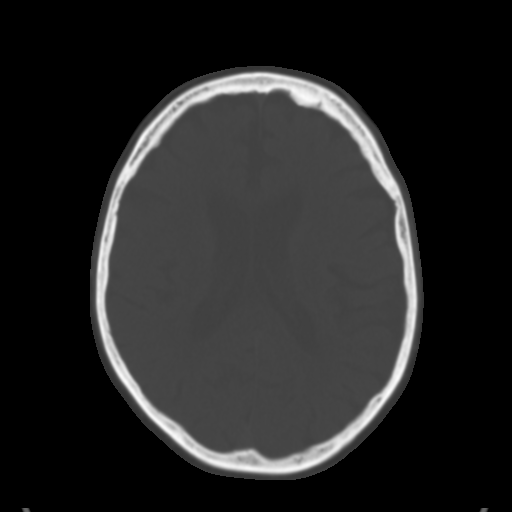
[im 20/32  brain]
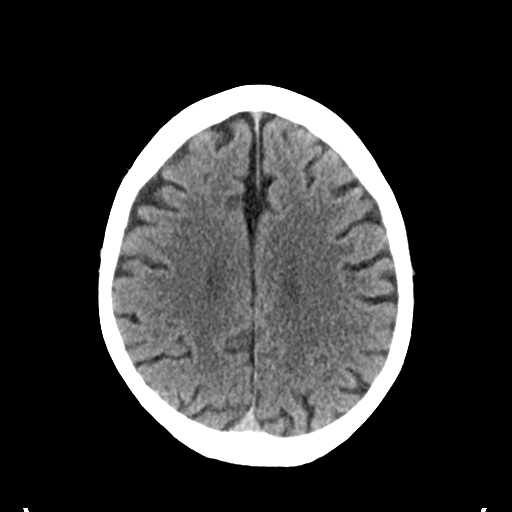
[im 23/32  brain]
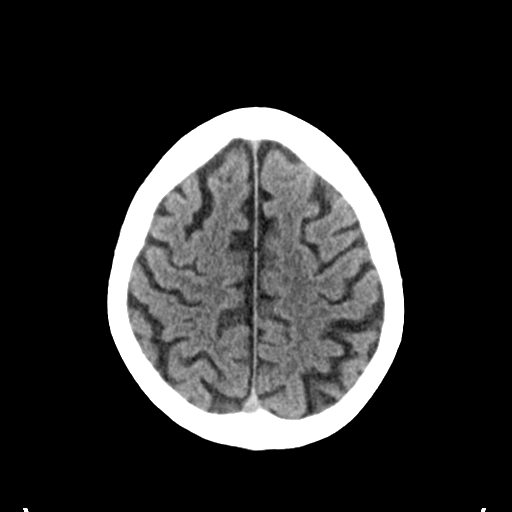
[im 26/32  brain]
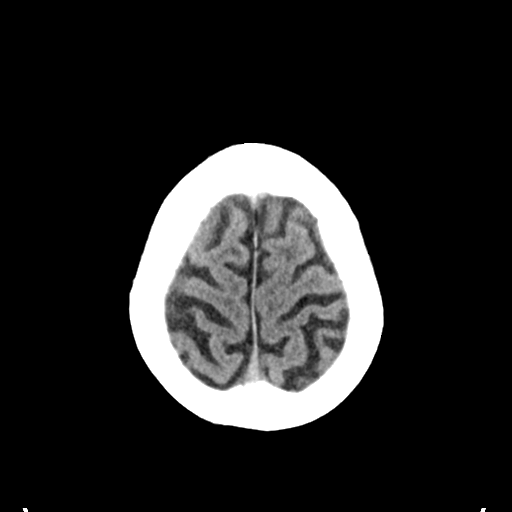
[im 29/32  brain]
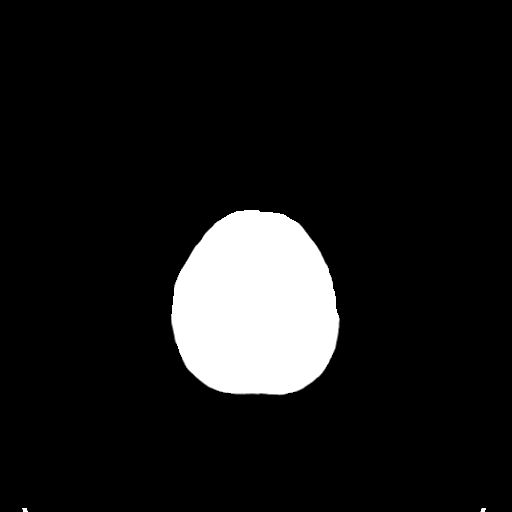
[im 29/32  bone]
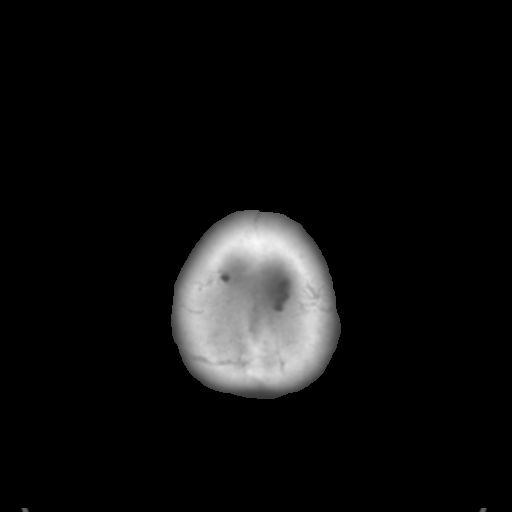

[Series 4: coronal soft tissue · coronal · 0.32mm/px · 3 of 65 slices shown]
[im 22/65  brain]
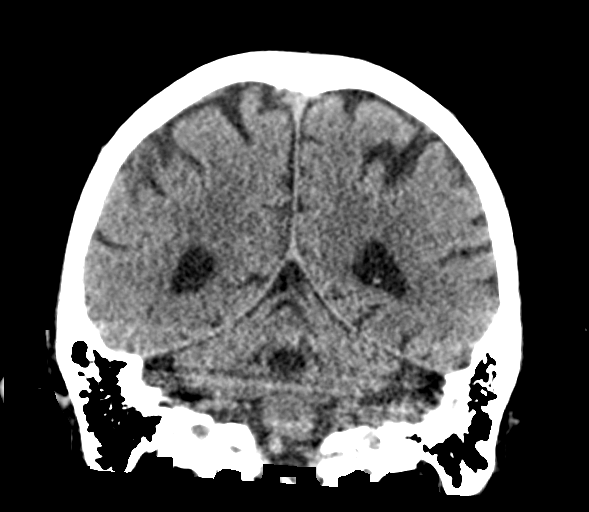
[im 29/65  brain]
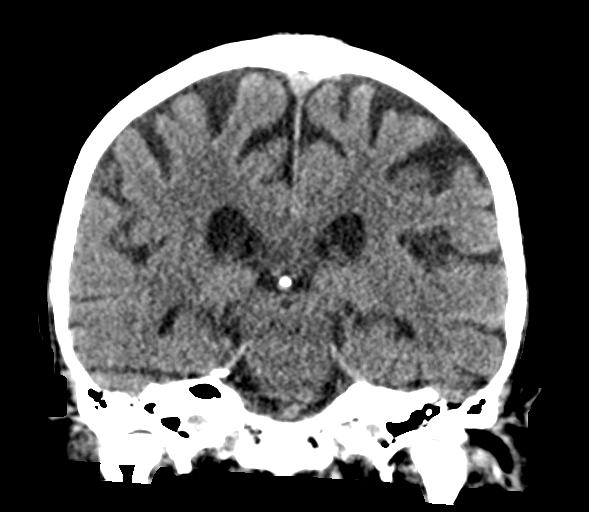
[im 36/65  brain]
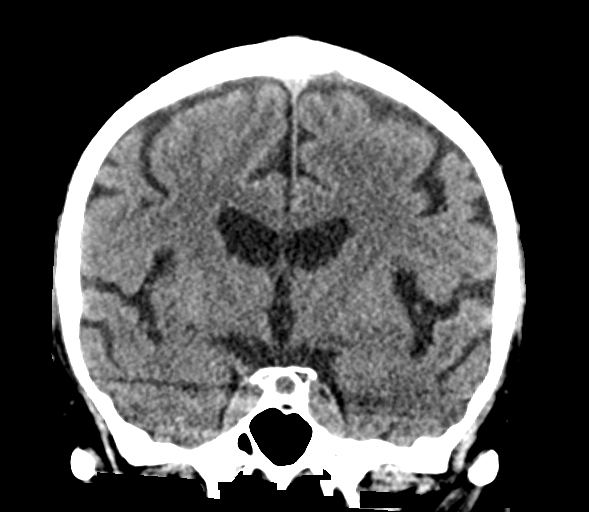

[Series 5: sagittal soft tissue · sagittal · 0.32mm/px · 3 of 55 slices shown]
[im 19/55  brain]
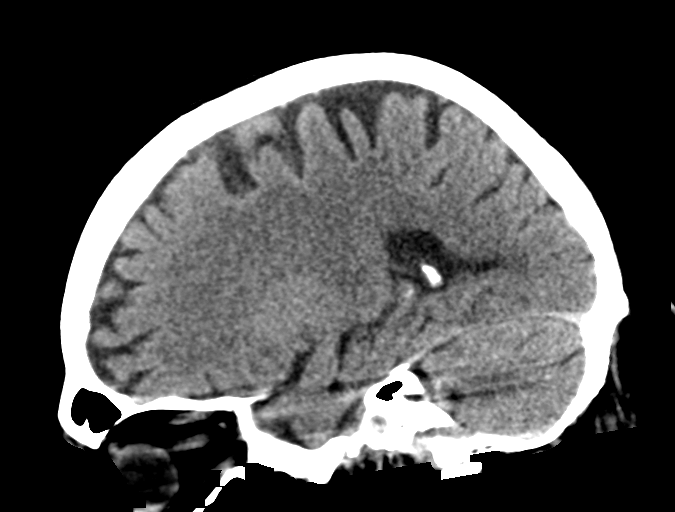
[im 28/55  brain]
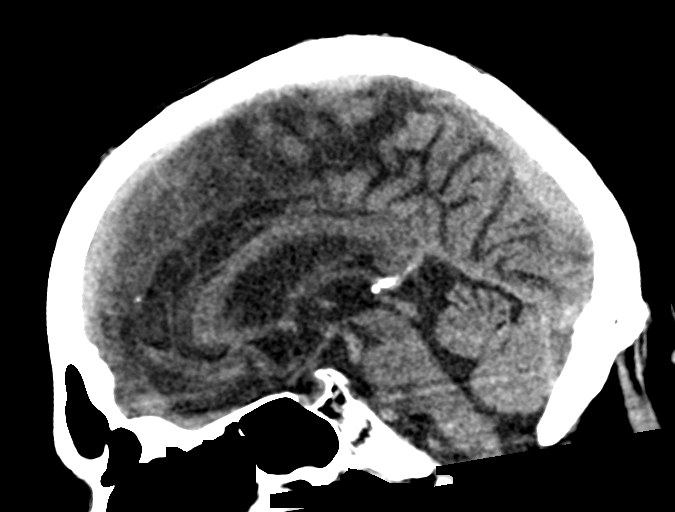
[im 37/55  brain]
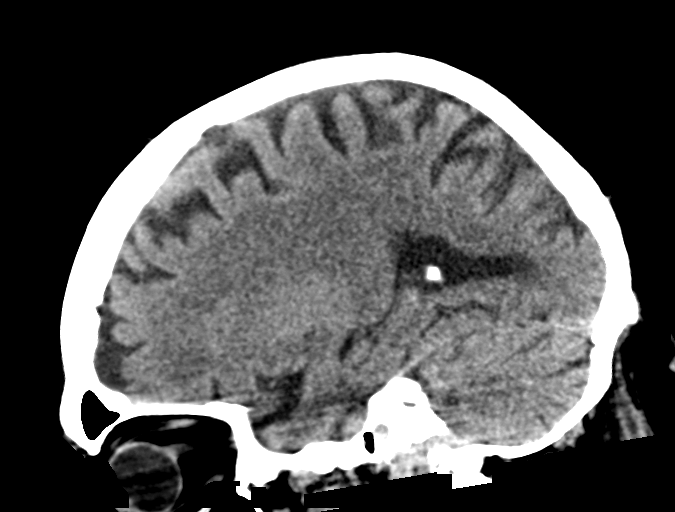

[15 of 47 positions shown; findings below may reference images not displayed]

FINDINGS: Brain: Mild age-related atrophy and chronic microvascular ischemic
changes. There is no acute intracranial hemorrhage. No mass effect
or midline shift no extra-axial fluid collection.

Vascular: No hyperdense vessel or unexpected calcification.

Skull: Normal. Negative for fracture or focal lesion.

Sinuses/Orbits: No acute finding.

Other: None
IMPRESSION: No acute intracranial pathology.

## 2021-01-10 IMAGING — CT CT CHEST W/O CM
2 of 3 series · 15 of 36 positions shown, 18 images · non-contrast
Comparison: None.

CLINICAL DATA: Chest pain

EXAM:
CT CHEST WITHOUT CONTRAST
TECHNIQUE: Multidetector CT imaging of the chest was performed following the
standard protocol without IV contrast.

[Series 2: thorax · axial · 0.83mm/px · z∈[-332,-56]mm · 12 of 162 slices shown, 15 images]
[im 12/162  mediastinal]
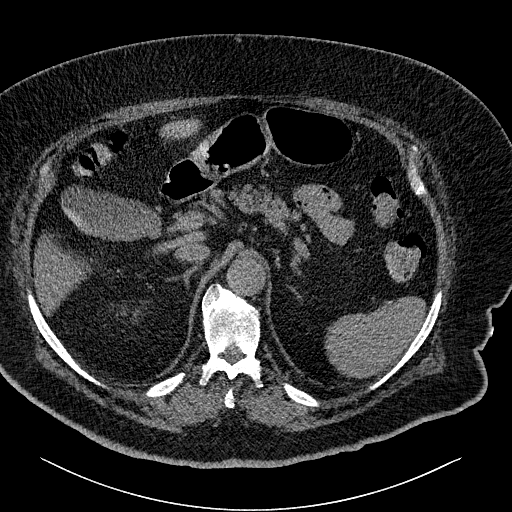
[im 12/162  lung]
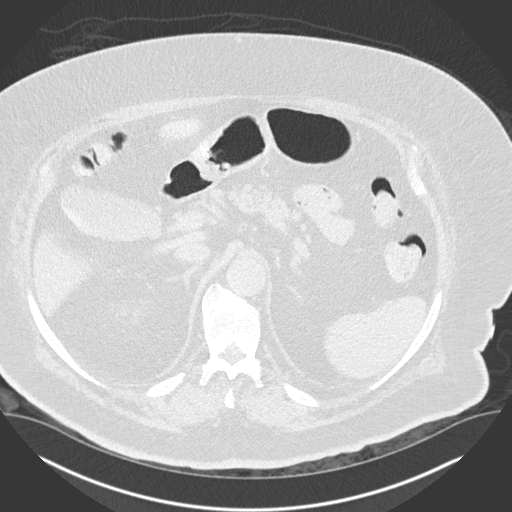
[im 24/162  lung]
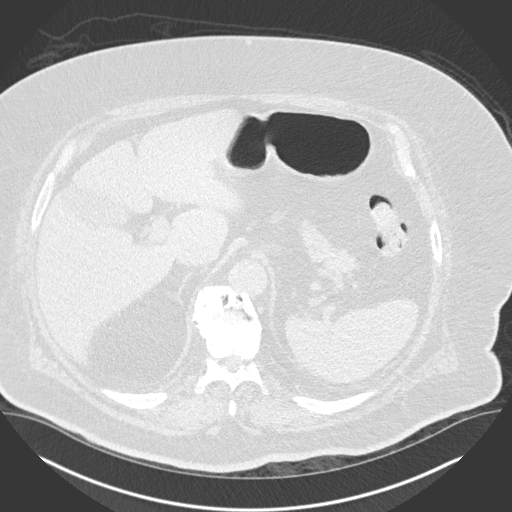
[im 36/162  lung]
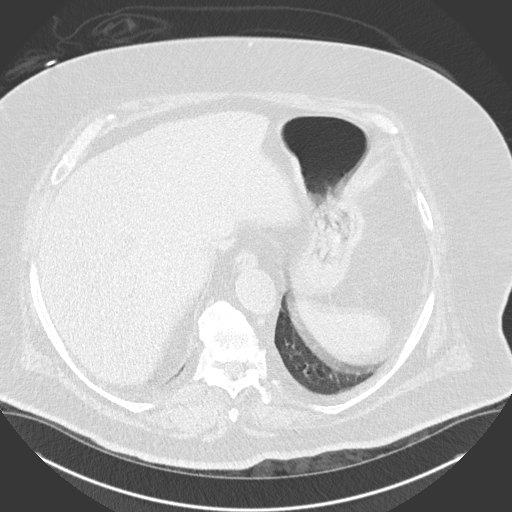
[im 48/162  lung]
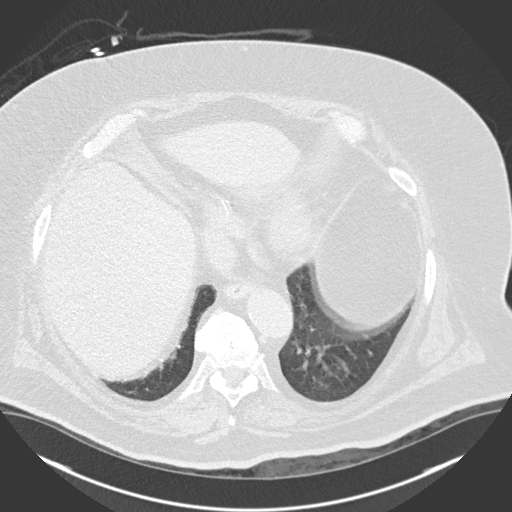
[im 60/162  mediastinal]
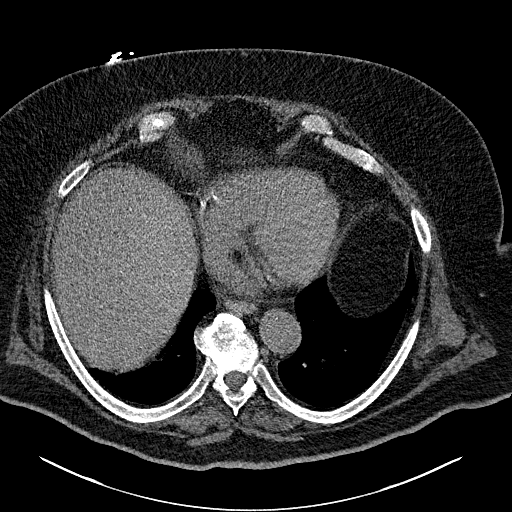
[im 60/162  lung]
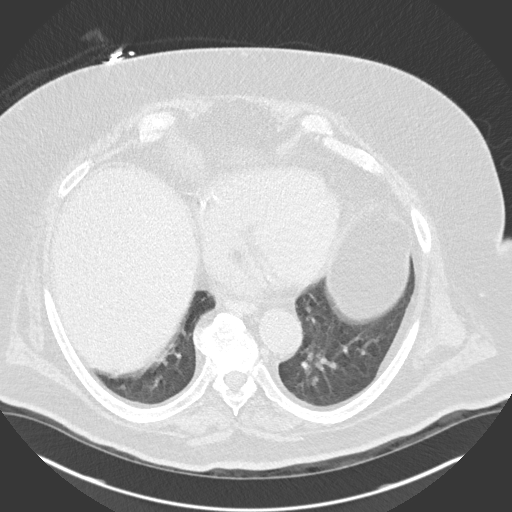
[im 72/162  lung]
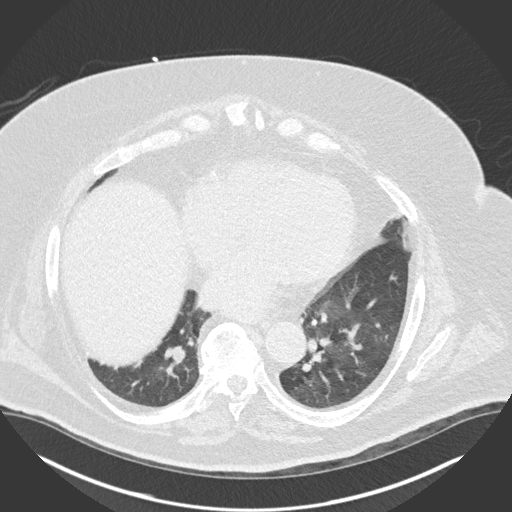
[im 90/162  lung]
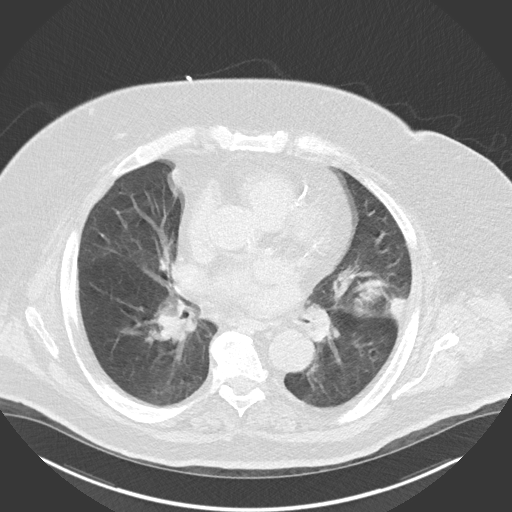
[im 102/162  lung]
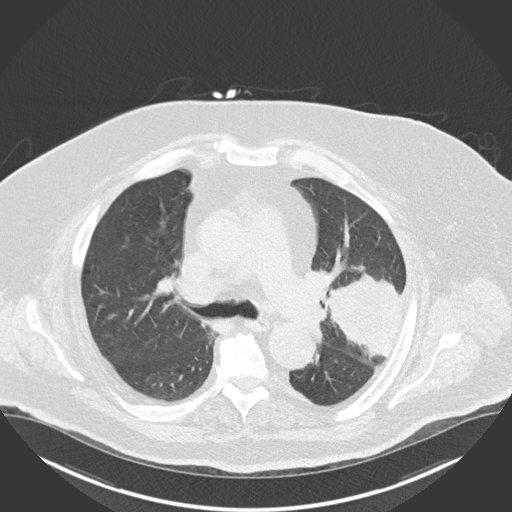
[im 114/162  mediastinal]
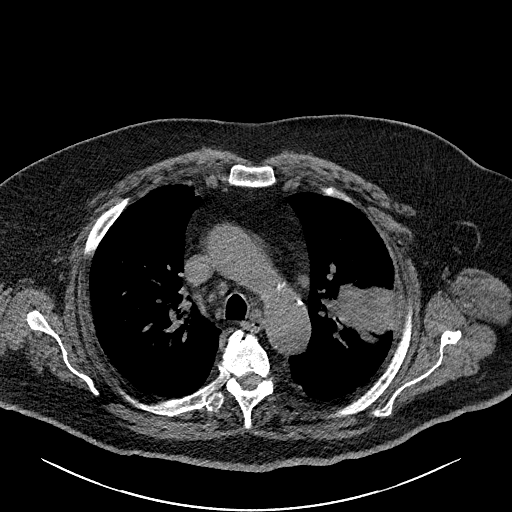
[im 114/162  lung]
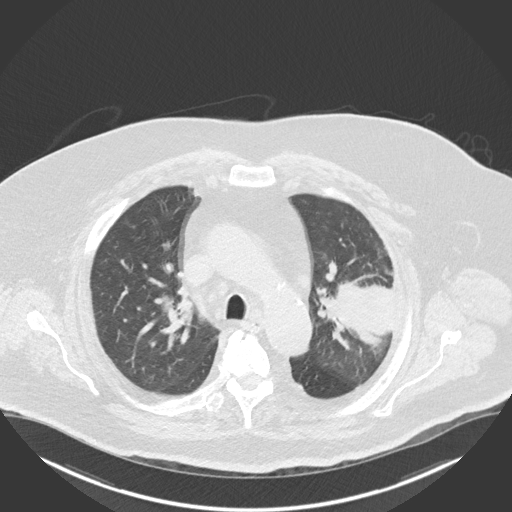
[im 126/162  lung]
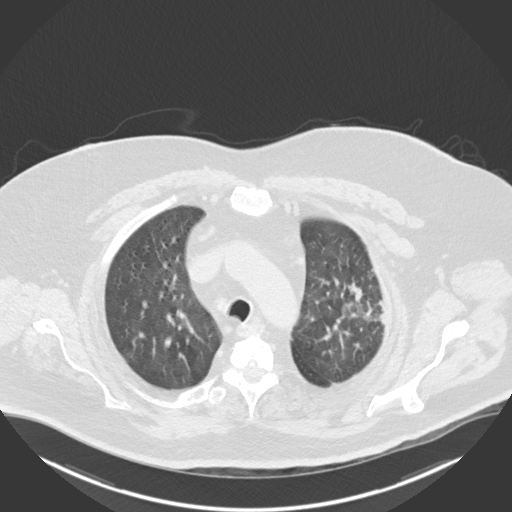
[im 138/162  lung]
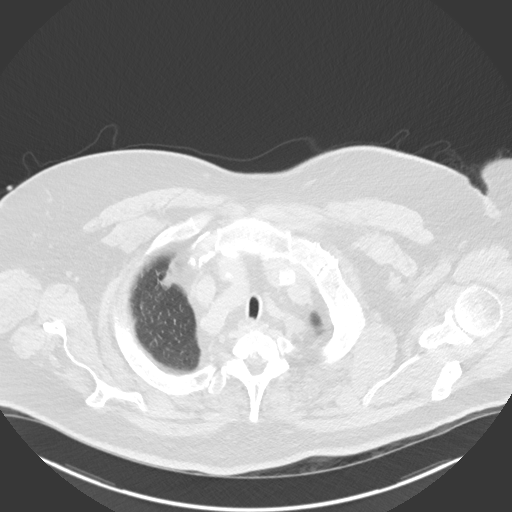
[im 150/162  lung]
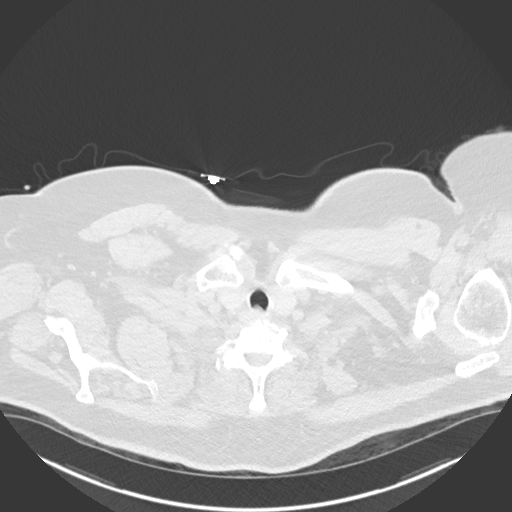

[Series 5: coronal · coronal · 0.70mm/px · 3 of 166 slices shown]
[im 34/166  lung]
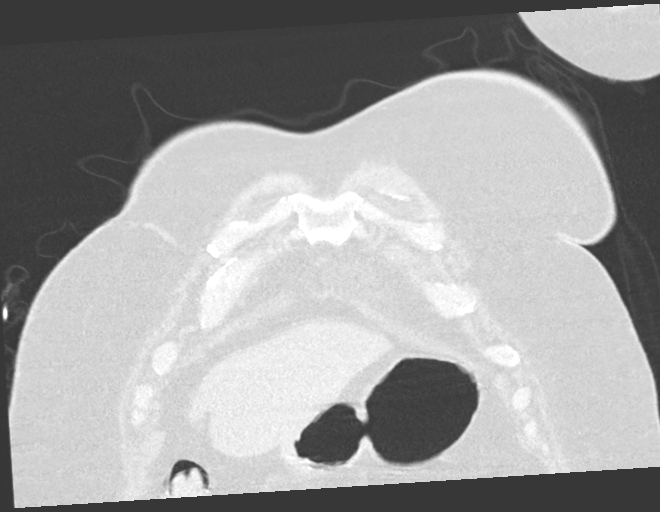
[im 67/166  lung]
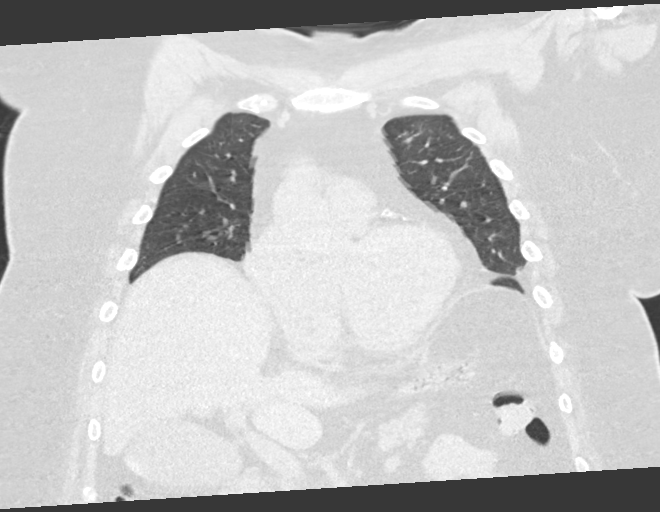
[im 100/166  lung]
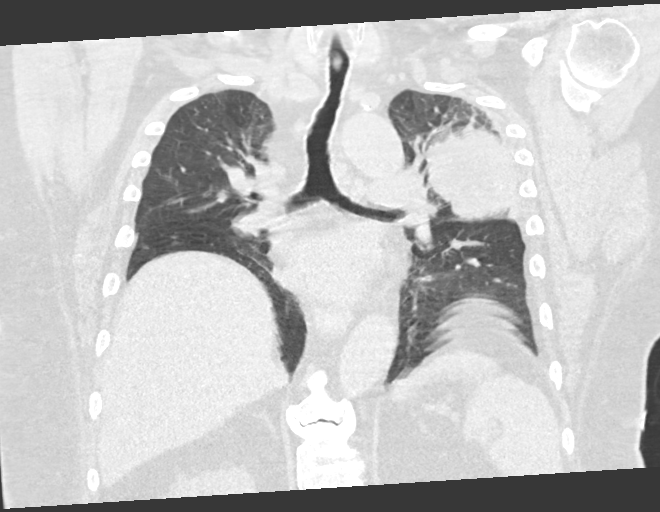

[15 of 36 positions shown; findings below may reference images not displayed]

FINDINGS: Cardiovascular: Calcific aortic atherosclerosis. Coronary artery
atherosclerosis. No pericardial effusion.

Mediastinum/Nodes: No enlarged mediastinal or axillary lymph nodes.
Thyroid gland, trachea, and esophagus demonstrate no significant
findings.

Lungs/Pleura: 6.7 x 6.0 cm mass in the left upper lobe.

Upper Abdomen: No acute abnormality.

Musculoskeletal: Flowing anterior osteophytes along the lower
thoracic spine.
IMPRESSION: 1. A 6.7 x 6.0 cm mass in the left upper lobe.
2. Coronary artery and aortic Atherosclerosis ([id number]).

## 2021-01-16 IMAGING — DX DG CHEST 1V PORT
1 series · 1 of 1 positions shown · non-contrast
Comparison: Portable exam 8077 hours compared to 02/21/2020

Correlation: CT chest 02/20/2020

CLINICAL DATA: Wheezing, fever, LEFT upper lobe mass, hypertension

EXAM:
PORTABLE CHEST 1 VIEW

[chest ap]
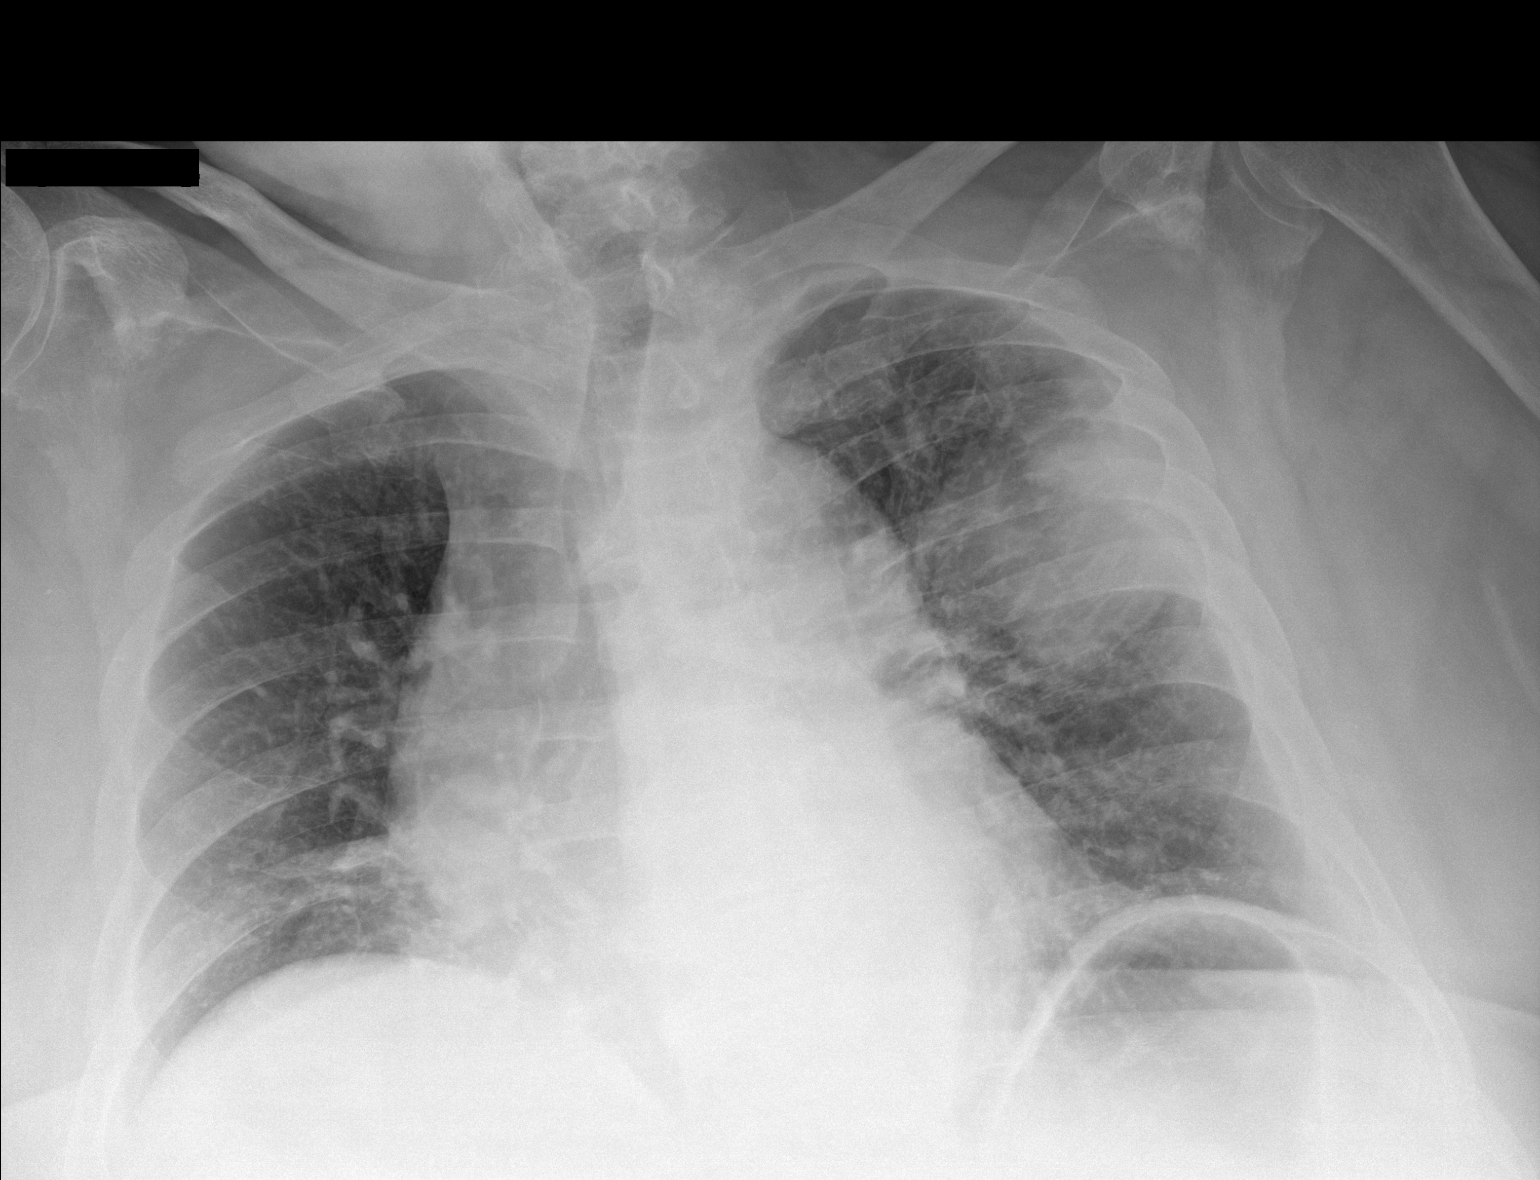

[1 of 1 positions shown; findings below may reference images not displayed]

FINDINGS: Normal heart size, mediastinal contours, and pulmonary vascularity.

Slightly rotated to RIGHT.

Large LEFT upper lobe mass again identified.

Generally decreased lung volumes with mild RIGHT basilar
atelectasis.

No acute infiltrate, pleural effusion or pneumothorax.
IMPRESSION: Persistent RIGHT basilar atelectasis.

LEFT upper lobe mass again identified; please refer to recent CT.
